# Patient Record
Sex: Female | Born: 1970 | Race: White | Hispanic: No | Marital: Married | Smoking: Former smoker
Health system: Southern US, Community
[De-identification: ages and names within clinical notes are randomized; demographics above are authoritative.]

## PROBLEM LIST (undated history)

## (undated) DIAGNOSIS — E785 Hyperlipidemia, unspecified: Secondary | ICD-10-CM

## (undated) DIAGNOSIS — I10 Essential (primary) hypertension: Secondary | ICD-10-CM

## (undated) DIAGNOSIS — E119 Type 2 diabetes mellitus without complications: Secondary | ICD-10-CM

## (undated) DIAGNOSIS — Z9889 Other specified postprocedural states: Secondary | ICD-10-CM

## (undated) DIAGNOSIS — R112 Nausea with vomiting, unspecified: Secondary | ICD-10-CM

## (undated) DIAGNOSIS — K2270 Barrett's esophagus without dysplasia: Secondary | ICD-10-CM

## (undated) HISTORY — DX: Nausea with vomiting, unspecified: R11.2

## (undated) HISTORY — DX: Essential (primary) hypertension: I10

## (undated) HISTORY — DX: Hyperlipidemia, unspecified: E78.5

## (undated) HISTORY — DX: Other specified postprocedural states: Z98.890

## (undated) HISTORY — PX: UPPER GASTROINTESTINAL ENDOSCOPY: SHX188

---

## 1998-04-04 ENCOUNTER — Inpatient Hospital Stay (HOSPITAL_COMMUNITY): Admission: AD | Admit: 1998-04-04 | Discharge: 1998-04-04 | Payer: Self-pay | Admitting: Obstetrics & Gynecology

## 1998-06-23 ENCOUNTER — Ambulatory Visit (HOSPITAL_COMMUNITY): Admission: RE | Admit: 1998-06-23 | Discharge: 1998-06-23 | Payer: Self-pay | Admitting: *Deleted

## 1998-07-15 ENCOUNTER — Ambulatory Visit (HOSPITAL_COMMUNITY): Admission: RE | Admit: 1998-07-15 | Discharge: 1998-07-15 | Payer: Self-pay | Admitting: *Deleted

## 1998-08-05 ENCOUNTER — Ambulatory Visit (HOSPITAL_COMMUNITY): Admission: RE | Admit: 1998-08-05 | Discharge: 1998-08-05 | Payer: Self-pay | Admitting: Obstetrics

## 1998-09-10 ENCOUNTER — Inpatient Hospital Stay (HOSPITAL_COMMUNITY): Admission: AD | Admit: 1998-09-10 | Discharge: 1998-09-10 | Payer: Self-pay | Admitting: *Deleted

## 1998-09-14 ENCOUNTER — Ambulatory Visit (HOSPITAL_COMMUNITY): Admission: RE | Admit: 1998-09-14 | Discharge: 1998-09-14 | Payer: Self-pay | Admitting: *Deleted

## 1998-11-13 ENCOUNTER — Inpatient Hospital Stay (HOSPITAL_COMMUNITY): Admission: AD | Admit: 1998-11-13 | Discharge: 1998-11-16 | Payer: Self-pay | Admitting: *Deleted

## 1998-11-17 ENCOUNTER — Encounter (HOSPITAL_COMMUNITY): Admission: RE | Admit: 1998-11-17 | Discharge: 1999-02-15 | Payer: Self-pay | Admitting: *Deleted

## 1999-03-20 ENCOUNTER — Encounter (HOSPITAL_COMMUNITY): Admission: RE | Admit: 1999-03-20 | Discharge: 1999-06-18 | Payer: Self-pay | Admitting: *Deleted

## 1999-06-20 ENCOUNTER — Encounter (HOSPITAL_COMMUNITY): Admission: RE | Admit: 1999-06-20 | Discharge: 1999-09-18 | Payer: Self-pay | Admitting: *Deleted

## 1999-07-24 ENCOUNTER — Encounter (INDEPENDENT_AMBULATORY_CARE_PROVIDER_SITE_OTHER): Payer: Self-pay | Admitting: Specialist

## 1999-07-24 ENCOUNTER — Other Ambulatory Visit: Admission: RE | Admit: 1999-07-24 | Discharge: 1999-07-24 | Payer: Self-pay | Admitting: Obstetrics

## 2001-09-04 ENCOUNTER — Other Ambulatory Visit: Admission: RE | Admit: 2001-09-04 | Discharge: 2001-09-04 | Payer: Self-pay | Admitting: Family Medicine

## 2002-12-10 ENCOUNTER — Other Ambulatory Visit: Admission: RE | Admit: 2002-12-10 | Discharge: 2002-12-10 | Payer: Self-pay | Admitting: Family Medicine

## 2006-12-19 ENCOUNTER — Ambulatory Visit (HOSPITAL_COMMUNITY): Admission: RE | Admit: 2006-12-19 | Discharge: 2006-12-19 | Payer: Self-pay | Admitting: Obstetrics and Gynecology

## 2009-01-23 ENCOUNTER — Inpatient Hospital Stay (HOSPITAL_COMMUNITY): Admission: AD | Admit: 2009-01-23 | Discharge: 2009-01-23 | Payer: Self-pay | Admitting: Obstetrics and Gynecology

## 2009-01-31 ENCOUNTER — Inpatient Hospital Stay (HOSPITAL_COMMUNITY): Admission: AD | Admit: 2009-01-31 | Discharge: 2009-01-31 | Payer: Self-pay | Admitting: Obstetrics & Gynecology

## 2009-02-09 ENCOUNTER — Inpatient Hospital Stay (HOSPITAL_COMMUNITY): Admission: AD | Admit: 2009-02-09 | Discharge: 2009-02-10 | Payer: Self-pay | Admitting: Obstetrics & Gynecology

## 2009-03-01 ENCOUNTER — Inpatient Hospital Stay (HOSPITAL_COMMUNITY): Admission: AD | Admit: 2009-03-01 | Discharge: 2009-03-03 | Payer: Self-pay | Admitting: Obstetrics and Gynecology

## 2010-09-22 LAB — CBC
HCT: 37.4 % (ref 36.0–46.0)
Hemoglobin: 10.7 g/dL — ABNORMAL LOW (ref 12.0–15.0)
Hemoglobin: 12.8 g/dL (ref 12.0–15.0)
MCHC: 34.2 g/dL (ref 30.0–36.0)
MCV: 87 fL (ref 78.0–100.0)
Platelets: 285 10*3/uL (ref 150–400)
Platelets: 361 10*3/uL (ref 150–400)
RBC: 4.3 MIL/uL (ref 3.87–5.11)
RDW: 14.9 % (ref 11.5–15.5)
RDW: 15.2 % (ref 11.5–15.5)
WBC: 19.7 10*3/uL — ABNORMAL HIGH (ref 4.0–10.5)

## 2010-09-22 LAB — RPR: RPR Ser Ql: NONREACTIVE

## 2010-09-23 LAB — URINALYSIS, ROUTINE W REFLEX MICROSCOPIC
Glucose, UA: NEGATIVE mg/dL
Nitrite: NEGATIVE
Protein, ur: NEGATIVE mg/dL
Urobilinogen, UA: 0.2 mg/dL (ref 0.0–1.0)

## 2010-09-23 LAB — URINE MICROSCOPIC-ADD ON

## 2010-09-23 LAB — FETAL FIBRONECTIN: Fetal Fibronectin: NEGATIVE

## 2013-11-10 ENCOUNTER — Ambulatory Visit
Admission: RE | Admit: 2013-11-10 | Discharge: 2013-11-10 | Disposition: A | Discharge status: Home / Self Care | Payer: Managed Care, Other (non HMO) | Source: Ambulatory Visit | Attending: Family | Admitting: Family

## 2013-11-10 ENCOUNTER — Other Ambulatory Visit: Payer: Self-pay | Admitting: Family

## 2013-11-10 DIAGNOSIS — M79609 Pain in unspecified limb: Secondary | ICD-10-CM

## 2014-07-06 ENCOUNTER — Ambulatory Visit: Payer: Managed Care, Other (non HMO)

## 2014-07-13 ENCOUNTER — Ambulatory Visit: Payer: Managed Care, Other (non HMO)

## 2014-07-20 ENCOUNTER — Ambulatory Visit: Payer: Managed Care, Other (non HMO)

## 2015-06-08 ENCOUNTER — Inpatient Hospital Stay: Admit: 2015-06-08 | Payer: Managed Care, Other (non HMO) | Admitting: Psychiatry

## 2015-10-03 ENCOUNTER — Other Ambulatory Visit: Payer: Self-pay | Admitting: Obstetrics and Gynecology

## 2015-10-03 DIAGNOSIS — R928 Other abnormal and inconclusive findings on diagnostic imaging of breast: Secondary | ICD-10-CM

## 2015-10-11 ENCOUNTER — Ambulatory Visit
Admission: RE | Admit: 2015-10-11 | Discharge: 2015-10-11 | Disposition: A | Discharge status: Home / Self Care | Payer: Managed Care, Other (non HMO) | Source: Ambulatory Visit | Attending: Obstetrics and Gynecology | Admitting: Obstetrics and Gynecology

## 2015-10-11 DIAGNOSIS — R928 Other abnormal and inconclusive findings on diagnostic imaging of breast: Secondary | ICD-10-CM

## 2016-05-26 ENCOUNTER — Encounter (HOSPITAL_COMMUNITY): Payer: Self-pay | Admitting: *Deleted

## 2016-05-26 ENCOUNTER — Ambulatory Visit (HOSPITAL_COMMUNITY)
Admission: EM | Admit: 2016-05-26 | Discharge: 2016-05-26 | Disposition: A | Discharge status: Nursing Home (Includes ICF) | Payer: Managed Care, Other (non HMO) | Attending: Family Medicine | Admitting: Family Medicine

## 2016-05-26 ENCOUNTER — Encounter (HOSPITAL_COMMUNITY): Payer: Self-pay

## 2016-05-26 ENCOUNTER — Emergency Department (HOSPITAL_COMMUNITY)
Admission: EM | Admit: 2016-05-26 | Discharge: 2016-05-26 | Disposition: A | Discharge status: Home / Self Care | Payer: Managed Care, Other (non HMO) | Attending: Emergency Medicine | Admitting: Emergency Medicine

## 2016-05-26 DIAGNOSIS — E119 Type 2 diabetes mellitus without complications: Secondary | ICD-10-CM | POA: Insufficient documentation

## 2016-05-26 DIAGNOSIS — R1310 Dysphagia, unspecified: Secondary | ICD-10-CM

## 2016-05-26 DIAGNOSIS — Z7984 Long term (current) use of oral hypoglycemic drugs: Secondary | ICD-10-CM | POA: Insufficient documentation

## 2016-05-26 DIAGNOSIS — Z87891 Personal history of nicotine dependence: Secondary | ICD-10-CM | POA: Insufficient documentation

## 2016-05-26 DIAGNOSIS — K228 Other specified diseases of esophagus: Secondary | ICD-10-CM | POA: Diagnosis not present

## 2016-05-26 DIAGNOSIS — K2289 Other specified disease of esophagus: Secondary | ICD-10-CM

## 2016-05-26 HISTORY — DX: Barrett's esophagus without dysplasia: K22.70

## 2016-05-26 HISTORY — DX: Type 2 diabetes mellitus without complications: E11.9

## 2016-05-26 MED ORDER — GI COCKTAIL ~~LOC~~
30.0000 mL | Freq: Once | ORAL | Status: AC
  Administered 2016-05-26: 30 mL via ORAL

## 2016-05-26 MED ORDER — GI COCKTAIL ~~LOC~~
ORAL | Status: AC
  Filled 2016-05-26: qty 30

## 2016-05-26 MED ORDER — SUCRALFATE 1 G PO TABS: 1.0000 g | ORAL_TABLET | Freq: Two times a day (BID) | ORAL | 0 refills | Status: DC

## 2016-05-26 MED ORDER — ESOMEPRAZOLE MAGNESIUM 40 MG PO CPDR: 40.0000 mg | DELAYED_RELEASE_CAPSULE | Freq: Every day | ORAL | 0 refills | Status: DC

## 2016-05-26 MED ORDER — ESOMEPRAZOLE MAGNESIUM 40 MG PO CPDR
40.0000 mg | DELAYED_RELEASE_CAPSULE | Freq: Every day | ORAL | 0 refills | Status: DC
Start: 2016-05-26 — End: 2016-05-26

## 2016-05-26 MED ORDER — SUCRALFATE 1 G PO TABS
1.0000 g | ORAL_TABLET | Freq: Once | ORAL | Status: AC
  Administered 2016-05-26: 1 g via ORAL
  Filled 2016-05-26: qty 1

## 2016-05-26 MED ORDER — SUCRALFATE 1 GM/10ML PO SUSP: 1.0000 g | Freq: Two times a day (BID) | ORAL | 0 refills | Status: DC

## 2016-05-26 NOTE — ED Triage Notes (Signed)
Also  Has  Burping

## 2016-05-26 NOTE — ED Provider Notes (Signed)
CSN: [id number]     Arrival date & time 05/26/16  1719 History   None    Chief Complaint  Patient presents with  . Dysphagia   (Consider location/radiation/quality/duration/timing/severity/associated sxs/prior Treatment) 45 y.o. female presents with esophageal pain that radiates to sternum and increase in belchingX 1 day. Patient states that the pain initially occurred while she was eating kidney beans last night. Patient states that initially she made herself vomit "Thinking I had something stuck there" Patient denies any relief after vomiting. Patient has a history of barett's esophagus with known ulcers. Patient states that she has been taking her pepcid and denies any nausea, vomiting or chest pain. Patient is belching during the assessment      Past Medical History:  Diagnosis Date  . Barrett esophagus   . Diabetes mellitus without complication ([place])    No past surgical history on file. History reviewed. No pertinent family history. Social History  Substance Use Topics  . Smoking status: Former Research scientist (life sciences)  . Smokeless tobacco: Never Used  . Alcohol use No   OB History    No data available     Review of Systems  Constitutional: Negative for chills and fever.  HENT: Negative for ear pain and sore throat.        Pain in "throat" like something is stuck"   Eyes: Negative for pain and visual disturbance.  Respiratory: Negative for cough and shortness of breath.   Cardiovascular: Negative for chest pain and palpitations.  Gastrointestinal: Negative for abdominal pain and vomiting.       Belching  Genitourinary: Negative for dysuria and hematuria.  Musculoskeletal: Negative for arthralgias and back pain.  Skin: Negative for color change and rash.  Neurological: Negative for seizures and syncope.  All other systems reviewed and are negative.   Allergies  Patient has no known allergies.  Home Medications   Prior to Admission medications   Medication Sig Start Date End  Date Taking? Authorizing Provider  Dapagliflozin-Metformin HCl (XIGDUO XR PO) Take by mouth.   Yes Historical Provider, MD  Dulaglutide (TRULICITY [place]) Inject into the skin.   Yes Historical Provider, MD  FLUoxetine HCl (PROZAC PO) Take by mouth.   Yes Historical Provider, MD  GLIPIZIDE PO Take by mouth.   Yes Historical Provider, MD  Omeprazole (PRILOSEC PO) Take by mouth.   Yes Historical Provider, MD   Meds Ordered and Administered this Visit   Medications  gi cocktail (Maalox,Lidocaine,Donnatal) (30 mLs Oral Given 05/26/16 1755)    BP 144/85 (BP Location: Right Arm)   Pulse 83   Temp 98.6 F (37 C) (Oral)   Resp 18   SpO2 99%  No data found.   Physical Exam  Constitutional: She is oriented to person, place, and time. She appears well-developed and well-nourished.  HENT:  Head: Normocephalic and atraumatic.  Eyes: Conjunctivae are normal.  Neck: Normal range of motion.  Cardiovascular: Normal rate and regular rhythm.   Pulmonary/Chest: Effort normal and breath sounds normal.  Abdominal: Soft. Bowel sounds are normal.  Neurological: She is alert and oriented to person, place, and time.  Psychiatric: She has a normal mood and affect.  Nursing note and vitals reviewed.   Urgent Care Course   Clinical Course     Procedures (including critical care time)  Labs Review Labs Reviewed - No data to display  Imaging Review No results found.     MDM   1. Esophageal pain        Gerline Legacy  Jaelynn Pozo, NP 05/26/16 1806

## 2016-05-26 NOTE — ED Notes (Signed)
Pt was able to swallow po medication successfully with mild discomfort

## 2016-05-26 NOTE — ED Triage Notes (Signed)
Pt   Noticed        Feels  Like    Something in throat      Last       She     Reports   Able  To  Eat   And  Able  To    Swallow   But  Feels      Like   A  Lump in throat   History  Of  gerd       And  barrets    Esophagus      She  Is  Sitting  Upright on exam table  In no  Severe  Distress

## 2016-05-26 NOTE — ED Provider Notes (Signed)
[facility] DEPT Provider Note   CSN: [id number] Arrival date & time: 05/26/16  1811     History   Chief Complaint Chief Complaint  Patient presents with  . Sore Throat    HPI Leslie Hanna is a 45 y.o. female.  Leslie Hanna is a 46 y.o. female with h/o barrett esophagus, DM, and HTN presents to ED with complaint of dysphagia. Patient reports after eating chili last night she experienced a burning pain sensation in her lower esophagus. She thought a kidney bean was stuck and preceded to make herself vomit. This did not provide relief. She was subsequently seen at the UC today and encouraged to come to the ED for further evaluation. She reports a burning pain in her lower esophagus that is only present with eating and drinking, otherwise she does not have pain. She reports a sensation that "something is stuck." She is able to still eat and drink, but states it is painful. No difficulty breathing. She denies any other complaints. She is followed for [facility] for her barrett esophagus.       Past Medical History:  Diagnosis Date  . Barrett esophagus   . Diabetes mellitus without complication ([place])     There are no active problems to display for this patient.   History reviewed. No pertinent surgical history.  OB History    No data available       Home Medications    Prior to Admission medications   Medication Sig Start Date End Date Taking? Authorizing Provider  Dapagliflozin-Metformin HCl (XIGDUO XR PO) Take by mouth.    Historical Provider, MD  Dulaglutide (TRULICITY [place]) Inject into the skin.    Historical Provider, MD  esomeprazole (NEXIUM) 40 MG capsule Take 1 capsule (40 mg total) by mouth daily. 05/26/16   Roxanna Mew, PA-C  FLUoxetine HCl (PROZAC PO) Take by mouth.    Historical Provider, MD  GLIPIZIDE PO Take by mouth.    Historical Provider, MD  sucralfate (CARAFATE) 1 g tablet Take 1 tablet (1 g total) by mouth 2 (two) times daily. 05/26/16    Roxanna Mew, PA-C    Family History No family history on file.  Social History Social History  Substance Use Topics  . Smoking status: Former Research scientist (life sciences)  . Smokeless tobacco: Never Used  . Alcohol use No     Allergies   Patient has no known allergies.   Review of Systems Review of Systems  Constitutional: Negative for chills, diaphoresis and fever.  HENT: Positive for trouble swallowing ( pain with swallowing).   Eyes: Negative for visual disturbance.  Respiratory: Negative for shortness of breath.   Cardiovascular: Negative for chest pain.  Gastrointestinal: Negative for abdominal pain, diarrhea, nausea and vomiting.  Genitourinary: Negative for dysuria and hematuria.  Musculoskeletal: Negative for back pain.  Skin: Negative for rash.  Neurological: Negative for syncope.     Physical Exam Updated Vital Signs BP 147/79   Pulse 81   Temp 98.7 F (37.1 C) (Oral)   Resp 21   Ht 5\' 6"  (1.676 m)   Wt 108.5 kg   SpO2 99%   BMI 38.60 kg/m   Physical Exam  Constitutional: She appears well-developed and well-nourished. No distress.  HENT:  Head: Normocephalic and atraumatic.  Mouth/Throat: Oropharynx is clear and moist. No oropharyngeal exudate.  Eyes: Conjunctivae and EOM are normal. Pupils are equal, round, and reactive to light. Right eye exhibits no discharge. Left eye exhibits no discharge. No scleral icterus.  Neck: Normal range of motion and phonation normal. Neck supple. No neck rigidity. Normal range of motion present.  Cardiovascular: Normal rate, regular rhythm, normal heart sounds and intact distal pulses.   No murmur heard. Pulmonary/Chest: Effort normal and breath sounds normal. No stridor. No respiratory distress. She has no wheezes. She has no rales. She exhibits no tenderness.  Abdominal: Soft. Bowel sounds are normal. She exhibits no distension. There is no tenderness. There is no rigidity, no rebound, no guarding and no CVA tenderness.    Musculoskeletal: Normal range of motion.  Lymphadenopathy:    She has no cervical adenopathy.  Neurological: She is alert. She is not disoriented. Coordination and gait normal. GCS eye subscore is 4. GCS verbal subscore is 5. GCS motor subscore is 6.  Skin: Skin is warm and dry. She is not diaphoretic.  Psychiatric: She has a normal mood and affect. Her behavior is normal.     ED Treatments / Results  Labs (all labs ordered are listed, but only abnormal results are displayed) Labs Reviewed - No data to display  EKG  EKG Interpretation None       Radiology No results found.  Procedures Procedures (including critical care time)  Medications Ordered in ED Medications  sucralfate (CARAFATE) tablet 1 g (1 g Oral Given 05/26/16 1949)     Initial Impression / Assessment and Plan / ED Course  I have reviewed the triage vital signs and the nursing notes.  Pertinent labs & imaging results that were available during my care of the patient were reviewed by me and considered in my medical decision making (see chart for details).  Clinical Course     Patient presents to ED with complaint of dysphagia onset last night after eating chili. Patient is able to eat and drink. Denies SOB. Patient is afebrile and non-toxic appearing in NAD. VSS.  Patient is reclining comfortably in the exam bed. Respirations are unlabored. No stridor. No cough. Patient tolerated PO medication in ED. Discussed with patient sxs may be secondary to barrett esophagus. Will change prilosec to nexium for improved acid blockage. Rx carafate. Encouraged follow up with GI next week for re-evaluation if symptoms persist. Return precautions given. Pt voiced understanding and is agreeable.   Upon d/c patient requested to speak with MD. Dr. Jeneen Rinks graciously spoke with patient. Agrees with plan.    Final Clinical Impressions(s) / ED Diagnoses   Final diagnoses:  Dysphagia, unspecified type    New Prescriptions New  Prescriptions   ESOMEPRAZOLE (NEXIUM) 40 MG CAPSULE    Take 1 capsule (40 mg total) by mouth daily.   SUCRALFATE (CARAFATE) 1 G TABLET    Take 1 tablet (1 g total) by mouth 2 (two) times daily.     Roxanna Mew, [place] 05/26/16 2013    Tanna Furry, MD 06/02/16 [phone]

## 2016-05-26 NOTE — ED Triage Notes (Signed)
Pt. Was eating beans last night and felt like something got stuck.  Made her self throw up and since then when you swallow you have chest burning.  Skin is warm and dry and pink. No sob noted. Pt. Denies any pain at present time.  She went to our urgent care and was sent down for further eval.  No distress  Noted.

## 2016-05-26 NOTE — Discharge Instructions (Signed)
Read the information below.  Your PPI is being changed from prilosec to nexium. You are also being prescribed Carafate. Please take as directed.  If symptoms persist, please follow up with your GI (stomach) doctor, I have provided the contact information for [facility] above, you may need an endoscope.  Use the prescribed medication as directed.  Please discuss all new medications with your pharmacist.   You may return to the Emergency Department at any time for worsening condition or any new symptoms that concern you. Return to ED if you develop difficulty swallowing, shortness of breath, coughing/choking, or any other new/concerning symptoms.

## 2016-07-19 ENCOUNTER — Ambulatory Visit (INDEPENDENT_AMBULATORY_CARE_PROVIDER_SITE_OTHER): Payer: Managed Care, Other (non HMO) | Admitting: Podiatry

## 2016-07-19 ENCOUNTER — Encounter: Payer: Self-pay | Admitting: Podiatry

## 2016-07-19 VITALS — BP 148/87 | HR 98 | Resp 16 | Ht 66.0 in | Wt 235.0 lb

## 2016-07-19 DIAGNOSIS — L600 Ingrowing nail: Secondary | ICD-10-CM

## 2016-07-19 NOTE — Patient Instructions (Signed)

## 2016-07-19 NOTE — Progress Notes (Signed)
   Subjective:    Patient ID: Leslie Hanna, female    DOB: [birth year], 46 y.o.   MRN: [id number]  HPI Chief Complaint  Patient presents with  . Nail Problem    Right foot; great toe-lateral side; pt stated, "Went to urgent care in January 2018 and got toe drained; was told had abscess of the nail bed"; x3 weeks      Review of Systems  All other systems reviewed and are negative.      Objective:   Physical Exam        Assessment & Plan:

## 2016-07-19 NOTE — Progress Notes (Signed)
Subjective:     Patient ID: Leslie Hanna, female   DOB: 05-16-1971, 46 y.o.   MRN: [id number]  HPI patient states that she's had chronic ingrown toenail the right big toe lateral border and it's been making it hard for her to wear shoe gear comfortably she's tried to trim it out herself without relief. Patient does have diabetes that she keeps under reasonably good control with her average level around 150   Review of Systems  All other systems reviewed and are negative.      Objective:   Physical Exam  Constitutional: She is oriented to person, place, and time.  Cardiovascular: Intact distal pulses.   Musculoskeletal: Normal range of motion.  Neurological: She is oriented to person, place, and time.  Skin: Skin is warm.  Nursing note and vitals reviewed.  neurovascular status intact muscle strength adequate range of motion within normal limits with patient found to have incurvated right hallux lateral border with pain when pressed and slight distal redness with no active drainage     Assessment:     Ingrown toenail deformity right hallux lateral border with pain    Plan:     H&P condition reviewed and recommended removal of the nail border. Patient wants procedure and today I explained the risk of surgery and I infiltrated the right hallux 60 Milligan times like Marcaine mixture remove the corner exposed matrix and applied phenol 3 applications 30 seconds followed by alcohol lavage and sterile dressing. Given instructions on soaks and reappoint

## 2016-08-09 ENCOUNTER — Ambulatory Visit (INDEPENDENT_AMBULATORY_CARE_PROVIDER_SITE_OTHER): Payer: Managed Care, Other (non HMO) | Admitting: Podiatry

## 2016-08-09 ENCOUNTER — Encounter: Payer: Self-pay | Admitting: Podiatry

## 2016-08-09 VITALS — BP 130/77 | HR 99 | Resp 16

## 2016-08-09 DIAGNOSIS — L03031 Cellulitis of right toe: Secondary | ICD-10-CM | POA: Diagnosis not present

## 2016-08-09 NOTE — Patient Instructions (Signed)

## 2016-08-10 NOTE — Progress Notes (Signed)
Subjective:     Patient ID: Leslie Hanna, female   DOB: Jun 16, 1971, 46 y.o.   MRN: [id number]  HPI patient presents stating she was concerned about the position of her hallux nail and discoloration with no active drainage noted   Review of Systems     Objective:   Physical Exam Neurovascular status intact with slight irritation of the hallux nail where the nail border was taken out but it's localized in nature with no proximal edema erythema drainage noted and no odor emitting from the area    Assessment:     Mild localized paronychia infection with no indications of proximal spread    Plan:     Reviewed condition and recommended continued soaks bandage usage during the day and letting it air dry at night. If any further redness should occur or any increased drainage odor or swelling all immediately we'll start an antibiotic but I would like to do not have to uses if we don't need to

## 2017-11-04 ENCOUNTER — Encounter: Payer: Self-pay | Admitting: Podiatry

## 2017-11-04 ENCOUNTER — Ambulatory Visit (INDEPENDENT_AMBULATORY_CARE_PROVIDER_SITE_OTHER): Payer: Managed Care, Other (non HMO)

## 2017-11-04 ENCOUNTER — Other Ambulatory Visit: Payer: Self-pay | Admitting: Podiatry

## 2017-11-04 ENCOUNTER — Ambulatory Visit: Payer: Managed Care, Other (non HMO) | Admitting: Podiatry

## 2017-11-04 VITALS — BP 143/84 | HR 91

## 2017-11-04 DIAGNOSIS — D492 Neoplasm of unspecified behavior of bone, soft tissue, and skin: Secondary | ICD-10-CM | POA: Diagnosis not present

## 2017-11-04 DIAGNOSIS — M79672 Pain in left foot: Secondary | ICD-10-CM

## 2017-11-06 NOTE — Progress Notes (Signed)
Subjective:   Patient ID: Leslie Hanna, female   DOB: 47 y.o.   MRN: [id number]   HPI Patient presents stating I have a small knot on the outside of my left foot but can become sore and I do not remember specific injury.  Patient presents going a little bit but it is remain relatively stable   ROS      Objective:  Physical Exam  Neurovascular status intact with outside of left foot showing small approximate 7 mm x 7 mm nodule that appears to be within subcutaneous tissue and is mildly tender     Assessment:  Probability for ganglionic cyst or inflammatory area with possible hemangioma or hematoma     Plan:  H&P x-ray reviewed and today I did careful injection to reduce the inflammation around this and try to shrink it.  I advised that it may require excision if symptoms were to persist or it were to get bigger or become more painful  X-rays indicate no indications of arthritis or calcification

## 2018-12-22 ENCOUNTER — Telehealth: Payer: Self-pay | Admitting: Hematology

## 2018-12-22 NOTE — Telephone Encounter (Signed)
Called and spoke with patient regarding NP Referral appointment from Dr Laverta Baltimore date/time/location/NO visitor policy/advised her to bring picture ID,Insuance Card & list of nay medications she was currently taking.

## 2018-12-31 ENCOUNTER — Inpatient Hospital Stay: Payer: Managed Care, Other (non HMO) | Admitting: Nurse Practitioner

## 2018-12-31 ENCOUNTER — Encounter: Payer: Self-pay | Admitting: Nurse Practitioner

## 2018-12-31 ENCOUNTER — Other Ambulatory Visit: Payer: Self-pay

## 2018-12-31 ENCOUNTER — Inpatient Hospital Stay: Payer: Managed Care, Other (non HMO) | Attending: Nurse Practitioner

## 2018-12-31 VITALS — BP 107/70 | HR 97 | Temp 98.9°F | Resp 19 | Ht 66.0 in | Wt 222.4 lb

## 2018-12-31 DIAGNOSIS — D72829 Elevated white blood cell count, unspecified: Secondary | ICD-10-CM | POA: Diagnosis present

## 2018-12-31 DIAGNOSIS — Z79899 Other long term (current) drug therapy: Secondary | ICD-10-CM | POA: Insufficient documentation

## 2018-12-31 LAB — CBC WITH DIFFERENTIAL (CANCER CENTER ONLY)
Abs Immature Granulocytes: 0.07 10*3/uL (ref 0.00–0.07)
Basophils Absolute: 0.1 10*3/uL (ref 0.0–0.1)
Basophils Relative: 0 %
Eosinophils Absolute: 0.3 10*3/uL (ref 0.0–0.5)
Eosinophils Relative: 2 %
HCT: 44.5 % (ref 36.0–46.0)
Hemoglobin: 14.5 g/dL (ref 12.0–15.0)
Immature Granulocytes: 0 %
Lymphocytes Relative: 20 %
Lymphs Abs: 3.2 10*3/uL (ref 0.7–4.0)
MCH: 27.5 pg (ref 26.0–34.0)
MCHC: 32.6 g/dL (ref 30.0–36.0)
MCV: 84.4 fL (ref 80.0–100.0)
Monocytes Absolute: 1 10*3/uL (ref 0.1–1.0)
Monocytes Relative: 6 %
Neutro Abs: 11.2 10*3/uL — ABNORMAL HIGH (ref 1.7–7.7)
Neutrophils Relative %: 72 %
Platelet Count: 363 10*3/uL (ref 150–400)
RBC: 5.27 MIL/uL — ABNORMAL HIGH (ref 3.87–5.11)
RDW: 14.3 % (ref 11.5–15.5)
WBC Count: 15.7 10*3/uL — ABNORMAL HIGH (ref 4.0–10.5)
nRBC: 0 % (ref 0.0–0.2)

## 2018-12-31 LAB — SAVE SMEAR(SSMR), FOR PROVIDER SLIDE REVIEW

## 2018-12-31 LAB — TECHNOLOGIST SMEAR REVIEW: Tech Review: NORMAL

## 2018-12-31 NOTE — Progress Notes (Addendum)
[facility]  [phone] [phone]  Clinic New Consult Note   Patient Care Team: Shanon Rosser, PA-C as PCP - General (Physician Assistant) 12/31/2018  CHIEF COMPLAINTS/PURPOSE OF CONSULTATION:  Abnormal WBC, "knot in neck", referred by Shanon Rosser, PA  HISTORY OF PRESENTING ILLNESS:  Leslie Hanna 48 y.o. female is here because of elevated white blood cell count and a "knot in her neck."  She was found to have abnormal CBC in 03/02/2009 with WBC 23.2 and mild anemia Hgb 10.7 at the time her son was born. She had a daughter born in 2000 with special needs who passed away at age 59.  Most recent CBC on 12/10/18 with WBC 14.9 and predominant neutrophils 11.29. She developed a left neck mass 3 weeks ago. Nodule has been stable in size.  Denies pain. She has seasonal allergies but denies symptoms of congestion, sore throat, cough.  She denies history of chronic inflammation or autoimmune disease.  She denies steroid use.  Denies abdominal surgery or splenectomy.  PMH is significant for DM, osteoarthritis, and history of GI ulcers.  She quit smoking cigarettes in 2015.  She works at [employer].  She denies personal or family history of blood disorder or leukemia/lymphoma.  Maternal grandfather had lung cancer, 3 paternal aunts had breast cancer in their 81s-50s.  Today she presents by herself.  She has had a frequent headache over the last few weeks, denies vision change.  Denies fever, night sweats, weight loss.  Appetite is normal.  Fatigue increases with blood sugar elevation.  Denies change in bowel habits, denies bleeding.  Has occasional lower abdominal pain.  Denies cough, chest pain, dyspnea.  Denies rash.  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  Denies pain in general.  Denies recent injury or infection.  Denies dental issue.  Has not been on recent antibiotics.  MEDICAL HISTORY:  Past Medical History:  Diagnosis Date  . Barrett esophagus   . Diabetes  mellitus without complication ([place])     SURGICAL HISTORY: No past surgical history on file.  SOCIAL HISTORY: Social History   Socioeconomic History  . Marital status: Married    Spouse name: Not on file  . Number of children: Not on file  . Years of education: Not on file  . Highest education level: Not on file  Occupational History  . Not on file  Social Needs  . Financial resource strain: Not on file  . Food insecurity    Worry: Not on file    Inability: Not on file  . Transportation needs    Medical: Not on file    Non-medical: Not on file  Tobacco Use  . Smoking status: Former Research scientist (life sciences)  . Smokeless tobacco: Never Used  Substance and Sexual Activity  . Alcohol use: No  . Drug use: No  . Sexual activity: Not on file  Lifestyle  . Physical activity    Days per week: Not on file    Minutes per session: Not on file  . Stress: Not on file  Relationships  . Social Herbalist on phone: Not on file    Gets together: Not on file    Attends religious service: Not on file    Active member of club or organization: Not on file    Attends meetings of clubs or organizations: Not on file    Relationship status: Not on file  . Intimate partner violence    Fear of current or ex partner:  Not on file    Emotionally abused: Not on file    Physically abused: Not on file    Forced sexual activity: Not on file  Other Topics Concern  . Not on file  Social History Narrative  . Not on file    FAMILY HISTORY: Family History  Problem Relation Age of Onset  . Cancer Paternal Aunt        breast  . Cancer Maternal Grandfather        lung  . Cancer Paternal Aunt        breast  . Cancer Paternal Aunt        breast     ALLERGIES:  has No Known Allergies.  MEDICATIONS:  Current Outpatient Medications  Medication Sig Dispense Refill  . dapagliflozin propanediol (FARXIGA) 10 MG TABS tablet Farxiga 10 mg tablet    . DULoxetine (CYMBALTA) 20 MG capsule Take 20 mg by  mouth daily.    Marland Kitchen lisinopril (ZESTRIL) 10 MG tablet Take 10 mg by mouth daily.    Marland Kitchen NOVOFINE 32G X 6 MM MISC     . omeprazole (PRILOSEC) 20 MG capsule Take 20 mg by mouth daily.    . rosuvastatin (CRESTOR) 10 MG tablet Take 10 mg by mouth daily.    . sitaGLIPtin (JANUVIA) 100 MG tablet Take 100 mg by mouth daily.    Tyler Aas FLEXTOUCH 200 UNIT/ML SOPN      No current facility-administered medications for this visit.     PHYSICAL EXAMINATION: ECOG PERFORMANCE STATUS: 0 - Asymptomatic  Vitals:   12/31/18 1332  BP: 107/70  Pulse: 97  Resp: 19  Temp: 98.9 F (37.2 C)  SpO2: 98%   Filed Weights   12/31/18 1332  Weight: 222 lb 6.4 oz (100.9 kg)    GENERAL:alert, no distress and comfortable SKIN: no rashes or significant lesions EYES:  sclera clear OROPHARYNX: no exudate or erythema LYMPH: approx 1 cm left posterior cervical adenopathy; no other palpable supra/infraclavicular, axillary, or inguinal lymphadenopathy  LUNGS: Respirations even and unlabored HEART:  no lower extremity edema ABDOMEN:abdomen soft, round. Musculoskeletal: no cyanosis of digits  PSYCH: alert & oriented x 3 with fluent speech NEURO: Normal gait  LABORATORY DATA:  I have reviewed the data as listed CBC Latest Ref Rng & Units 12/31/2018 03/02/2009 03/01/2009  WBC 4.0 - 10.5 K/uL 15.7(H) 23.2(H) 19.7(H)  Hemoglobin 12.0 - 15.0 g/dL 14.5 10.7(L) 12.8  Hematocrit 36.0 - 46.0 % 44.5 31.1(L) 37.4  Platelets 150 - 400 K/uL 363 285 361    RADIOGRAPHIC STUDIES: I have personally reviewed the radiological images as listed and agreed with the findings in the report. No results found.  ASSESSMENT & PLAN: 48 year old female with past medical history of DM, OA, and seasonal allergies  1.  Leukocytosis with predominant neutrophilia -We reviewed her medical record in detail which shows leukocytosis with predominant neutrophillia dating back to 2010, I do not have access to serial labs.  Repeat CBC today shows WBC  15.7, ANC 11.2.  She has a palpable left posterior cervical node, she is otherwise asymptomatic.  -Dr. Burr Medico discussed common causes of elevated WBC and neutrophils including recent infection, inflammation, splenomegaly, medication, or MPN/bone marrow disorder.  -Given her mild leukocytosis over many years ago, her leukocytosis is likely reactive -Dr. Burr Medico will review smear and obtain BCR/ABL and JAK2 to r/o MPN. Will obtain abd [place] to r/o splenomegaly.  -if the above work up is negative, will likely repeat labs in 6 months and  f/u in 1 year. If CBC remains stable, she can f/u with PCP and with [place] only as needed. She will monitor the palpable LN.  -Patient agrees with the plan  PLAN: -Lab today -abd [place] in 1-2 weeks -Will call patient after work up is complete to review results  -Lab in 6 and 12 months -F/u in 1 year  Orders Placed This Encounter  Procedures  . US Abdomen Complete    Standing Status:   Future    Standing Expiration Date:   12/31/2019    Order Specific Question:   Reason for Exam (SYMPTOM  OR DIAGNOSIS REQUIRED)    Answer:   elevated WBC, evaluate spleen    Order Specific Question:   Preferred imaging location?    Answer:   [facility]  . CBC with Differential ([place] Only)    Standing Status:   Standing    Number of Occurrences:   20    Standing Expiration Date:   12/31/2019  . Save Smear (SSMR)    Standing Status:   Future    Number of Occurrences:   1    Standing Expiration Date:   12/31/2019  . Technologist smear review    Standing Status:   Future    Number of Occurrences:   1    Standing Expiration Date:   12/31/2019  . JAK2 (INCLUDING V617F AND EXON 12), MPL,& CALR W/RFL MPN PANEL (NGS)    Standing Status:   Future    Number of Occurrences:   1    Standing Expiration Date:   12/31/2019  . BCR ABL1 FISH ([facility])    Standing Status:   Future    Number of Occurrences:   1    Standing Expiration Date:   12/31/2019    All questions were answered.  The patient knows to call the clinic with any problems, questions or concerns.     Alla Feeling, NP 12/31/2018 4:43 PM  Addendum  I have seen the patient, examined her. I agree with the assessment and and plan and have edited the notes.   Ms Aguado presented with leukocytosis, with predominant neutrophils. She had documented elevated WBC 10 years ago, and per pt she has chronic leukocytosis since 20's. This is likely reactive, possible related to allergy, although she does not have daily symptoms. Will repeat lab and review her smear, and rule out CML and MPN by BCR/ABL and JAK2 mutation tests, and will get abdominal US to rule out splemagely. If work up negative, will f/u once in 6 months with CBC and diff, and probably as needed in future.  Truitt Merle  12/31/2018

## 2019-01-01 ENCOUNTER — Telehealth: Payer: Self-pay | Admitting: Hematology

## 2019-01-01 NOTE — Telephone Encounter (Signed)
Scheduled appt per 7/15 los. ° °Printed and mailed appt calendar. °

## 2019-01-06 ENCOUNTER — Telehealth: Payer: Self-pay

## 2019-01-06 ENCOUNTER — Encounter: Payer: Self-pay | Admitting: Nurse Practitioner

## 2019-01-06 NOTE — Telephone Encounter (Signed)
Per Leslie Hanna scheduled patient for ultrasound of liver and spleen Friday 01/09/19 @9 :30am here at [facility]. Patient is to arrive by 9:15am and to be NPO after midnight. Patient is aware of appointment date, time, and instructions.

## 2019-01-09 ENCOUNTER — Ambulatory Visit (HOSPITAL_COMMUNITY)
Admission: RE | Admit: 2019-01-09 | Discharge: 2019-01-09 | Disposition: A | Discharge status: Home / Self Care | Payer: Managed Care, Other (non HMO) | Source: Ambulatory Visit | Attending: Nurse Practitioner | Admitting: Nurse Practitioner

## 2019-01-09 ENCOUNTER — Other Ambulatory Visit: Payer: Self-pay

## 2019-01-09 DIAGNOSIS — D72829 Elevated white blood cell count, unspecified: Secondary | ICD-10-CM | POA: Insufficient documentation

## 2019-01-15 ENCOUNTER — Encounter: Payer: Self-pay | Admitting: Nurse Practitioner

## 2019-01-26 ENCOUNTER — Telehealth: Payer: Self-pay | Admitting: Nurse Practitioner

## 2019-01-26 NOTE — Telephone Encounter (Signed)
Called patient to review work up including BCR/ABL, JAK2 and [place] are negative. No evidence of enlarged spleen or bone marrow disease. Leukocytosis and enlarged LN is likely reactive. F/u as scheduled. She is aware to call sooner with any new or worsening concerns. She appreciates the call.  Cira Rue, NP  01/26/2019

## 2019-01-28 LAB — JAK2 (INCLUDING V617F AND EXON 12), MPL,& CALR W/RFL MPN PANEL (NGS)

## 2019-01-28 LAB — BCR ABL1 FISH (GENPATH)

## 2019-02-12 ENCOUNTER — Encounter: Payer: Self-pay | Admitting: Nurse Practitioner

## 2019-02-16 ENCOUNTER — Encounter: Payer: Self-pay | Admitting: Nurse Practitioner

## 2019-07-03 ENCOUNTER — Inpatient Hospital Stay: Payer: Managed Care, Other (non HMO) | Attending: Hematology

## 2019-07-03 ENCOUNTER — Other Ambulatory Visit: Payer: Self-pay

## 2019-07-03 DIAGNOSIS — D72829 Elevated white blood cell count, unspecified: Secondary | ICD-10-CM | POA: Insufficient documentation

## 2019-07-03 LAB — CBC WITH DIFFERENTIAL (CANCER CENTER ONLY)
Abs Immature Granulocytes: 0.05 10*3/uL (ref 0.00–0.07)
Basophils Absolute: 0.1 10*3/uL (ref 0.0–0.1)
Basophils Relative: 0 %
Eosinophils Absolute: 0.2 10*3/uL (ref 0.0–0.5)
Eosinophils Relative: 2 %
HCT: 43.7 % (ref 36.0–46.0)
Hemoglobin: 14.6 g/dL (ref 12.0–15.0)
Immature Granulocytes: 0 %
Lymphocytes Relative: 22 %
Lymphs Abs: 2.8 10*3/uL (ref 0.7–4.0)
MCH: 28.2 pg (ref 26.0–34.0)
MCHC: 33.4 g/dL (ref 30.0–36.0)
MCV: 84.5 fL (ref 80.0–100.0)
Monocytes Absolute: 0.6 10*3/uL (ref 0.1–1.0)
Monocytes Relative: 5 %
Neutro Abs: 9.1 10*3/uL — ABNORMAL HIGH (ref 1.7–7.7)
Neutrophils Relative %: 71 %
Platelet Count: 338 10*3/uL (ref 150–400)
RBC: 5.17 MIL/uL — ABNORMAL HIGH (ref 3.87–5.11)
RDW: 13.8 % (ref 11.5–15.5)
WBC Count: 12.8 10*3/uL — ABNORMAL HIGH (ref 4.0–10.5)
nRBC: 0 % (ref 0.0–0.2)

## 2019-07-06 ENCOUNTER — Telehealth: Payer: Self-pay

## 2019-07-06 NOTE — Telephone Encounter (Signed)
-----   Message from Alla Feeling, NP sent at 07/06/2019  8:18 AM EST ----- Please let her know [facility] are better compared to 6 months ago. No other abnormalities on CBC, I have no concerns. Please have her call us if she has new issues. Otherwise will keep lab and f/u in 12/2019. Thanks [employer]

## 2019-07-06 NOTE — Telephone Encounter (Signed)
TC to Pt to inform her Per Lacie that [facility] was better compared to 6 months ago. Informed Pt. That there were no other abnormalities on the CBC and she has no concerns. Pt informed to call if any problems or concerns and Lab and F/U visit will be 12/2019. Pt verbalized understanding no further problems or concerns noted.

## 2019-12-30 NOTE — Progress Notes (Signed)
[place]   [phone] Fax:([phone]) [phone]   Clinic Follow up Note   Patient Care Team: Shanon Rosser, PA-C as PCP - General (Physician Assistant)  Date of Service:  12/31/2019  CHIEF COMPLAINT: F/u of Leukocytosis   CURRENT THERAPY:  Observation    INTERVAL HISTORY:  Leslie Hanna is here for a follow up of Leukocytosis. She was last seen by NP Lacie 1 year ago. She presents to the clinic alone. She denies any new changes or allergy symptoms. She denies cough. She notes she is doing well. She notes she use to smoke for 22.5 years and quit 6 years ago. She notes her 62 year old sister had the top of her colon polyp was cancerous.     REVIEW OF SYSTEMS:   Constitutional: Denies fevers, chills or abnormal weight loss Eyes: Denies blurriness of vision Ears, nose, mouth, throat, and face: Denies mucositis or sore throat Respiratory: Denies cough, dyspnea or wheezes Cardiovascular: Denies palpitation, chest discomfort or lower extremity swelling Gastrointestinal:  Denies nausea, heartburn or change in bowel habits Skin: Denies abnormal skin rashes Lymphatics: Denies new lymphadenopathy or easy bruising Neurological:Denies numbness, tingling or new weaknesses Behavioral/Psych: Mood is stable, no new changes  All other systems were reviewed with the patient and are negative.  MEDICAL HISTORY:  Past Medical History:  Diagnosis Date  . Barrett esophagus   . Diabetes mellitus without complication ([place])     SURGICAL HISTORY: History reviewed. No pertinent surgical history.  I have reviewed the social history and family history with the patient and they are unchanged from previous note.  ALLERGIES:  has No Known Allergies.  MEDICATIONS:  Current Outpatient Medications  Medication Sig Dispense Refill  . dapagliflozin propanediol (FARXIGA) 10 MG TABS tablet Farxiga 10 mg tablet    . DULoxetine (CYMBALTA) 20 MG capsule Take 20 mg by mouth daily.      . metFORMIN (GLUCOPHAGE) 1000 MG tablet Take 1,000 mg by mouth 2 (two) times daily.    Marland Kitchen omeprazole (PRILOSEC) 20 MG capsule Take 20 mg by mouth daily.    . rosuvastatin (CRESTOR) 10 MG tablet Take 10 mg by mouth daily.    Marland Kitchen lisinopril (ZESTRIL) 10 MG tablet Take 10 mg by mouth daily. (Patient not taking: Reported on 12/31/2019)    . NOVOFINE 32G X 6 MM MISC  (Patient not taking: Reported on 12/31/2019)    . sitaGLIPtin (JANUVIA) 100 MG tablet Take 100 mg by mouth daily. (Patient not taking: Reported on 12/31/2019)    . TRESIBA FLEXTOUCH 200 UNIT/ML SOPN  (Patient not taking: Reported on 12/31/2019)     No current facility-administered medications for this visit.    PHYSICAL EXAMINATION: ECOG PERFORMANCE STATUS: 0 - Asymptomatic  Vitals:   12/31/19 1248  BP: 131/80  Pulse: 92  Resp: 18  Temp: (!) 97.3 F (36.3 C)  SpO2: 99%   Filed Weights   12/31/19 1248  Weight: 225 lb 6.4 oz (102.2 kg)    GENERAL:alert, no distress and comfortable SKIN: skin color, texture, turgor are normal, no rashes or significant lesions EYES: normal, Conjunctiva are pink and non-injected, sclera clear  NECK: supple, thyroid normal size, non-tender, without nodularity LYMPH:  no palpable lymphadenopathy in the cervical, axillary  LUNGS: clear to auscultation and percussion with normal breathing effort HEART: regular rate & rhythm and no murmurs and no lower extremity edema ABDOMEN:abdomen soft, non-tender and normal bowel sounds Musculoskeletal:no cyanosis of digits and no clubbing  NEURO: alert & oriented  x 3 with fluent speech, no focal motor/sensory deficits  LABORATORY DATA:  I have reviewed the data as listed CBC Latest Ref Rng & Units 12/31/2019 07/03/2019 12/31/2018  WBC 4.0 - 10.5 K/uL 13.5(H) 12.8(H) 15.7(H)  Hemoglobin 12.0 - 15.0 g/dL 14.4 14.6 14.5  Hematocrit 36 - 46 % 43.3 43.7 44.5  Platelets 150 - 400 K/uL 296 338 363     No flowsheet data found.    RADIOGRAPHIC STUDIES: I have  personally reviewed the radiological images as listed and agreed with the findings in the report. No results found.   ASSESSMENT & PLAN:  Leslie Hanna is a 49 y.o. female with    1. Leukocytosis with predominant neutrophilia -Her lab history has showed leukocytosis with predominant neutrophillia dating back to 2010, I do not have access to serial labs. Repeat CBC from 12/31/18 shows WBC 15.7, ANC 11.2.  She has a palpable left posterior cervical node on exam that day, she is otherwise asymptomatic.  -I discussed common causes of elevated WBC and neutrophils including recent infection, inflammation, splenomegaly, medication, or MPN/bone marrow disorder.  -Her work up including BCR/ABL, JAK2 and 01/09/19 [place] were all negative.  -She did smoke for 22.5 years and quit 6 years ago. This can contribute to reactive leukocytosis  -Given her mild leukocytosis over many years and it has been stable, her leukocytosis is likely reactive -She is clinically doing well and stable. She denies recent infections or obvious allergy flares. Physical exam today unremarkable. Today's labs were reviewed with her. Her WBC initially 15.7 last year, improved to 12.8 in 06/2019 and 13.5 today. Her ANC also improved to 9.8 today.  -This remains reactive. I discussed likely from allergy or mild inflammation in body.  -She can continue observation with her PCP. She can f/u with me as needed in the future.    2. Cancer Screening  -She will proceed with colonoscopy in the next 1-2 given her family history of early colon cancer.  -I recommend she continue yearly mammograms and regular Pap smears -She will continue to f/u with her PCP for health management.    PLAN: -Copy note and labs to her PCP  -F/u as needed in the future.     No problem-specific Assessment & Plan notes found for this encounter.   No orders of the defined types were placed in this encounter.  All questions were answered. The patient knows to  call the clinic with any problems, questions or concerns. No barriers to learning was detected. The total time spent in the appointment was 20 minutes.     Truitt Merle, MD 12/31/2019   I, Joslyn Devon, am acting as scribe for Truitt Merle, MD.   I have reviewed the above documentation for accuracy and completeness, and I agree with the above.

## 2019-12-31 ENCOUNTER — Inpatient Hospital Stay: Payer: Managed Care, Other (non HMO) | Admitting: Hematology

## 2019-12-31 ENCOUNTER — Encounter: Payer: Self-pay | Admitting: Hematology

## 2019-12-31 ENCOUNTER — Inpatient Hospital Stay: Payer: Managed Care, Other (non HMO) | Attending: Hematology

## 2019-12-31 ENCOUNTER — Other Ambulatory Visit: Payer: Self-pay

## 2019-12-31 DIAGNOSIS — D72828 Other elevated white blood cell count: Secondary | ICD-10-CM

## 2019-12-31 DIAGNOSIS — Z79899 Other long term (current) drug therapy: Secondary | ICD-10-CM | POA: Diagnosis not present

## 2019-12-31 DIAGNOSIS — D72829 Elevated white blood cell count, unspecified: Secondary | ICD-10-CM | POA: Insufficient documentation

## 2019-12-31 DIAGNOSIS — E119 Type 2 diabetes mellitus without complications: Secondary | ICD-10-CM | POA: Insufficient documentation

## 2019-12-31 LAB — CBC WITH DIFFERENTIAL (CANCER CENTER ONLY)
Abs Immature Granulocytes: 0.04 10*3/uL (ref 0.00–0.07)
Basophils Absolute: 0.1 10*3/uL (ref 0.0–0.1)
Basophils Relative: 1 %
Eosinophils Absolute: 0.3 10*3/uL (ref 0.0–0.5)
Eosinophils Relative: 2 %
HCT: 43.3 % (ref 36.0–46.0)
Hemoglobin: 14.4 g/dL (ref 12.0–15.0)
Immature Granulocytes: 0 %
Lymphocytes Relative: 20 %
Lymphs Abs: 2.7 10*3/uL (ref 0.7–4.0)
MCH: 28.9 pg (ref 26.0–34.0)
MCHC: 33.3 g/dL (ref 30.0–36.0)
MCV: 86.8 fL (ref 80.0–100.0)
Monocytes Absolute: 0.6 10*3/uL (ref 0.1–1.0)
Monocytes Relative: 5 %
Neutro Abs: 9.8 10*3/uL — ABNORMAL HIGH (ref 1.7–7.7)
Neutrophils Relative %: 72 %
Platelet Count: 296 10*3/uL (ref 150–400)
RBC: 4.99 MIL/uL (ref 3.87–5.11)
RDW: 14.3 % (ref 11.5–15.5)
WBC Count: 13.5 10*3/uL — ABNORMAL HIGH (ref 4.0–10.5)
nRBC: 0 % (ref 0.0–0.2)

## 2020-01-01 ENCOUNTER — Telehealth: Payer: Self-pay | Admitting: Hematology

## 2020-01-01 NOTE — Telephone Encounter (Signed)
F/u as needed per 7/15 los.

## 2020-09-14 ENCOUNTER — Encounter: Payer: Self-pay | Admitting: Podiatry

## 2020-09-14 ENCOUNTER — Ambulatory Visit (INDEPENDENT_AMBULATORY_CARE_PROVIDER_SITE_OTHER): Payer: Managed Care, Other (non HMO)

## 2020-09-14 ENCOUNTER — Other Ambulatory Visit: Payer: Self-pay

## 2020-09-14 ENCOUNTER — Ambulatory Visit: Payer: Managed Care, Other (non HMO) | Admitting: Podiatry

## 2020-09-14 DIAGNOSIS — M79672 Pain in left foot: Secondary | ICD-10-CM

## 2020-09-14 DIAGNOSIS — M7672 Peroneal tendinitis, left leg: Secondary | ICD-10-CM

## 2020-09-14 DIAGNOSIS — M6740 Ganglion, unspecified site: Secondary | ICD-10-CM

## 2020-09-14 MED ORDER — TRIAMCINOLONE ACETONIDE 10 MG/ML IJ SUSP: 10.0000 mg | Freq: Once | INTRAMUSCULAR | Status: DC

## 2020-09-14 NOTE — Progress Notes (Signed)
Subjective:   Patient ID: Leslie Hanna, female   DOB: 50 y.o.   MRN: [id number]   HPI Patient states she has a lot of inflammation in the outside of the left foot and feels like it is a cyst and its been enlarged and painful.  States it is worsened recently and making it hard to walk.  Patient's not been seen almost 3 years   ROS      Objective:  Physical Exam  Neurovascular status intact with patient found to have some enlargement around the base of the fifth metatarsal left with the possibility of a cyst versus an inflammatory tendinitis at insertion     Assessment:  Possibility of peroneal tendinitis versus possibility ganglionic cyst     Plan:  H&P x-rays reviewed and today I did a sterile anesthetic block of the entire area and using sterile technique I did aspirate I was unable to get out any gelatinous fluid I then went ahead and injected with half cc dexamethasone Kenalog to reduce inflammation swelling and this should take care of her problem.  She will be seen back as symptoms indicate  X-rays indicate that there is no signs of calcification or bony injury surrounding the base of fifth metatarsal left

## 2021-04-10 ENCOUNTER — Encounter: Payer: Self-pay | Admitting: Internal Medicine

## 2021-05-26 ENCOUNTER — Ambulatory Visit (AMBULATORY_SURGERY_CENTER): Payer: Managed Care, Other (non HMO) | Admitting: *Deleted

## 2021-05-26 ENCOUNTER — Other Ambulatory Visit: Payer: Self-pay

## 2021-05-26 ENCOUNTER — Encounter: Payer: Self-pay | Admitting: Internal Medicine

## 2021-05-26 VITALS — Ht 66.0 in | Wt 222.0 lb

## 2021-05-26 DIAGNOSIS — Z1211 Encounter for screening for malignant neoplasm of colon: Secondary | ICD-10-CM

## 2021-05-26 DIAGNOSIS — Z800 Family history of malignant neoplasm of digestive organs: Secondary | ICD-10-CM

## 2021-05-26 MED ORDER — NA SULFATE-K SULFATE-MG SULF 17.5-3.13-1.6 GM/177ML PO SOLN: 1.0000 | ORAL | 0 refills | Status: DC

## 2021-05-26 NOTE — Progress Notes (Signed)
Patient's pre-visit was done today over the phone with the patient. Name,DOB and address verified. Patient denies any allergies to Eggs and Soy. Patient denies any problems with anesthesia/sedation. Patient is not taking any diet pills or blood thinners. No home Oxygen. Packet of Prep instructions mailed to patient including a copy of a consent form-pt is aware. Prep instructions sent to pt's MyChart (if activated).Patient understands to call us back with any questions or concerns. Patient is aware of our care-partner policy and [id number] safety protocol.   EMMI education assigned to the patient for the procedure, sent to [place].

## 2021-06-08 ENCOUNTER — Ambulatory Visit (AMBULATORY_SURGERY_CENTER): Payer: Managed Care, Other (non HMO) | Admitting: Internal Medicine

## 2021-06-08 ENCOUNTER — Other Ambulatory Visit: Payer: Self-pay

## 2021-06-08 ENCOUNTER — Encounter: Payer: Self-pay | Admitting: Internal Medicine

## 2021-06-08 VITALS — BP 130/60 | HR 83 | Temp 99.1°F | Resp 15 | Ht 66.0 in | Wt 222.0 lb

## 2021-06-08 DIAGNOSIS — Z1211 Encounter for screening for malignant neoplasm of colon: Secondary | ICD-10-CM

## 2021-06-08 DIAGNOSIS — D125 Benign neoplasm of sigmoid colon: Secondary | ICD-10-CM | POA: Diagnosis not present

## 2021-06-08 DIAGNOSIS — D123 Benign neoplasm of transverse colon: Secondary | ICD-10-CM | POA: Diagnosis not present

## 2021-06-08 DIAGNOSIS — Z800 Family history of malignant neoplasm of digestive organs: Secondary | ICD-10-CM

## 2021-06-08 DIAGNOSIS — D124 Benign neoplasm of descending colon: Secondary | ICD-10-CM

## 2021-06-08 MED ORDER — SODIUM CHLORIDE 0.9 % IV SOLN: 500.0000 mL | Freq: Once | INTRAVENOUS | Status: DC

## 2021-06-08 NOTE — Patient Instructions (Signed)
Resume previous medications.  Diverticulosis.  7 polyps removed and sent to pathology.   Await results for final recommendations.  Handouts on findings given to patient.     YOU HAD AN ENDOSCOPIC PROCEDURE TODAY AT [facility]:   Refer to the procedure report that was given to you for any specific questions about what was found during the examination.  If the procedure report does not answer your questions, please call your gastroenterologist to clarify.  If you requested that your care partner not be given the details of your procedure findings, then the procedure report has been included in a sealed envelope for you to review at your convenience later.  YOU SHOULD EXPECT: Some feelings of bloating in the abdomen. Passage of more gas than usual.  Walking can help get rid of the air that was put into your GI tract during the procedure and reduce the bloating. If you had a lower endoscopy (such as a colonoscopy or flexible sigmoidoscopy) you may notice spotting of blood in your stool or on the toilet paper. If you underwent a bowel prep for your procedure, you may not have a normal bowel movement for a few days.  Please Note:  You might notice some irritation and congestion in your nose or some drainage.  This is from the oxygen used during your procedure.  There is no need for concern and it should clear up in a day or so.  SYMPTOMS TO REPORT IMMEDIATELY:  Following lower endoscopy (colonoscopy or flexible sigmoidoscopy):  Excessive amounts of blood in the stool  Significant tenderness or worsening of abdominal pains  Swelling of the abdomen that is new, acute  Fever of 100F or higher   For urgent or emergent issues, a gastroenterologist can be reached at any hour by calling [phone]. Do not use MyChart messaging for urgent concerns.    DIET:  We do recommend a small meal at first, but then you may proceed to your regular diet.  Drink plenty of fluids but you should  avoid alcoholic beverages for 24 hours.  ACTIVITY:  You should plan to take it easy for the rest of today and you should NOT DRIVE or use heavy machinery until tomorrow (because of the sedation medicines used during the test).    FOLLOW UP: Our staff will call the number listed on your records 48-72 hours following your procedure to check on you and address any questions or concerns that you may have regarding the information given to you following your procedure. If we do not reach you, we will leave a message.  We will attempt to reach you two times.  During this call, we will ask if you have developed any symptoms of COVID 19. If you develop any symptoms (ie: fever, flu-like symptoms, shortness of breath, cough etc.) before then, please call [phone].  If you test positive for Covid 19 in the 2 weeks post procedure, please call and report this information to [place].    If any biopsies were taken you will be contacted by phone or by letter within the next 1-3 weeks.  Please call us at [phone] if you have not heard about the biopsies in 3 weeks.    SIGNATURES/CONFIDENTIALITY: You and/or your care partner have signed paperwork which will be entered into your electronic medical record.  These signatures attest to the fact that that the information above on your After Visit Summary has been reviewed and is understood.  Full responsibility of  the confidentiality of this discharge information lies with you and/or your care-partner.

## 2021-06-08 NOTE — Progress Notes (Signed)
Report given to PACU, vss 

## 2021-06-08 NOTE — Progress Notes (Signed)
GASTROENTEROLOGY PROCEDURE H&P NOTE   Primary Care Physician: Shanon Rosser, PA-C    Reason for Procedure:   Screening for colon cancer   Plan:    colonoscopy  Patient is appropriate for endoscopic procedure(s) in the ambulatory ([place]) setting.  The nature of the procedure, as well as the risks, benefits, and alternatives were carefully and thoroughly reviewed with the patient. Ample time for discussion and questions allowed. The patient understood, was satisfied, and agreed to proceed.   Her sister had colorectal cancer at age 59    HPI: Leslie Hanna is a 50 y.o. female who presents for screening colonoscopy.  Medical history as below.  Tolerated the prep.  No recent chest pain or shortness of breath.  No abdominal pain today.  Past Medical History:  Diagnosis Date   Barrett esophagus    Diabetes mellitus without complication (HCC)    Hyperlipidemia    Hypertension    Post-operative nausea and vomiting    post op headache    Past Surgical History:  Procedure Laterality Date   UPPER GASTROINTESTINAL ENDOSCOPY     10 years ago -[place]    Prior to Admission medications   Medication Sig Start Date End Date Taking? Authorizing Provider  dapagliflozin propanediol (FARXIGA) 10 MG TABS tablet Farxiga 10 mg tablet   Yes [provider]  DULoxetine (CYMBALTA) 20 MG capsule Take 20 mg by mouth daily.   Yes [provider]  lisinopril (ZESTRIL) 10 MG tablet Take 10 mg by mouth daily.   Yes [provider]  metFORMIN (GLUCOPHAGE) 1000 MG tablet Take 1,000 mg by mouth 2 (two) times daily. 09/17/19  Yes [provider]  omeprazole (PRILOSEC) 20 MG capsule Take 20 mg by mouth daily.   Yes [provider]  rosuvastatin (CRESTOR) 10 MG tablet Take 10 mg by mouth daily.   Yes [provider]  TURMERIC PO Take by mouth.   Yes [provider]    Current Outpatient Medications  Medication Sig Dispense Refill    dapagliflozin propanediol (FARXIGA) 10 MG TABS tablet Farxiga 10 mg tablet     DULoxetine (CYMBALTA) 20 MG capsule Take 20 mg by mouth daily.     lisinopril (ZESTRIL) 10 MG tablet Take 10 mg by mouth daily.     metFORMIN (GLUCOPHAGE) 1000 MG tablet Take 1,000 mg by mouth 2 (two) times daily.     omeprazole (PRILOSEC) 20 MG capsule Take 20 mg by mouth daily.     rosuvastatin (CRESTOR) 10 MG tablet Take 10 mg by mouth daily.     TURMERIC PO Take by mouth.     Current Facility-Administered Medications  Medication Dose Route Frequency Provider Last Rate Last Admin   0.9 %  sodium chloride infusion  500 mL Intravenous Once Gervis Gaba, Lajuan Lines, MD       triamcinolone acetonide (KENALOG) 10 MG/ML injection 10 mg  10 mg Other Once Regal, Norman S, DPM        Allergies as of 06/08/2021   (No Known Allergies)    Family History  Problem Relation Age of Onset   Colon polyps Sister    Colon cancer Sister 67   Cancer Paternal Aunt        breast   Cancer Paternal Aunt        breast   Cancer Paternal Aunt        breast    Cancer Maternal Grandfather        lung  Social History   Socioeconomic History   Marital status: Married    Spouse name: Not on file   Number of children: Not on file   Years of education: Not on file   Highest education level: Not on file  Occupational History   Not on file  Tobacco Use   Smoking status: Former   Smokeless tobacco: Never  Vaping Use   Vaping Use: Never used  Substance and Sexual Activity   Alcohol use: No   Drug use: No   Sexual activity: Not on file  Other Topics Concern   Not on file  Social History Narrative   Not on file   Social Determinants of Health   Financial Resource Strain: Not on file  Food Insecurity: Not on file  Transportation Needs: Not on file  Physical Activity: Not on file  Stress: Not on file  Social Connections: Not on file  Intimate Partner Violence: Not on file    Physical Exam: Vital signs in last 24  hours: @BP  (!) 161/84    Pulse 93    Temp 99.1 F (37.3 C) (Skin)    Ht 5\' 6"  (1.676 m)    Wt 222 lb (100.7 kg)    SpO2 96%    BMI 35.83 kg/m  GEN: NAD EYE: Sclerae anicteric ENT: MMM CV: Non-tachycardic Pulm: CTA b/l GI: Soft, NT/ND NEURO:  Alert & Oriented x 3   Zenovia Jarred, MD [facility]  06/08/2021 9:16 AM

## 2021-06-08 NOTE — Progress Notes (Signed)
Pt's states no medical or surgical changes since previsit or office visit. VS assessed by S.M 

## 2021-06-08 NOTE — Progress Notes (Signed)
Called to room to assist during endoscopic procedure.  Patient ID and intended procedure confirmed with present staff. Received instructions for my participation in the procedure from the performing physician.  

## 2021-06-08 NOTE — Op Note (Signed)
[place] Patient Name: Leslie Hanna Procedure Date: 06/08/2021 9:18 AM MRN: [id number] Endoscopist: Jerene Bears , MD Age: 50 Referring MD:  Date of Birth: 1970/12/11 Gender: Female Account #: [id number] Procedure:                Colonoscopy Indications:              Screening in patient at increased risk: Family                            history of 1st-degree relative with colorectal                            cancer before age 59 years (sister age 51), This is                            the patient's first colonoscopy Medicines:                Monitored Anesthesia Care Procedure:                Pre-Anesthesia Assessment:                           - Prior to the procedure, a History and Physical                            was performed, and patient medications and                            allergies were reviewed. The patient's tolerance of                            previous anesthesia was also reviewed. The risks                            and benefits of the procedure and the sedation                            options and risks were discussed with the patient.                            All questions were answered, and informed consent                            was obtained. Prior Anticoagulants: The patient has                            taken no previous anticoagulant or antiplatelet                            agents. ASA Grade Assessment: II - A patient with                            mild systemic disease. After reviewing the risks  and benefits, the patient was deemed in                            satisfactory condition to undergo the procedure.                           After obtaining informed consent, the colonoscope                            was passed under direct vision. Throughout the                            procedure, the patient's blood pressure, pulse, and                            oxygen saturations were  monitored continuously. The                            Olympus CF-HQ190L ([id number]) Colonoscope was                            introduced through the anus and advanced to the                            cecum, identified by appendiceal orifice and                            ileocecal valve. The colonoscopy was performed                            without difficulty. The patient tolerated the                            procedure well. The quality of the bowel                            preparation was good. The ileocecal valve,                            appendiceal orifice, and rectum were photographed. Scope In: 9:29:57 AM Scope Out: 9:49:07 AM Scope Withdrawal Time: 0 hours 16 minutes 27 seconds  Total Procedure Duration: 0 hours 19 minutes 10 seconds  Findings:                 The perianal and digital rectal examinations were                            normal.                           Four sessile polyps were found in the transverse                            colon. The polyps were 4 to 6 mm in size. These  polyps were removed with a cold snare. Resection                            and retrieval were complete.                           Two sessile polyps were found in the descending                            colon. The polyps were 4 to 5 mm in size. These                            polyps were removed with a cold snare. Resection                            and retrieval were complete.                           A 5 mm polyp was found in the sigmoid colon. The                            polyp was sessile. The polyp was removed with a                            cold snare. Resection and retrieval were complete.                           Multiple small and large-mouthed diverticula were                            found in the sigmoid colon and descending colon.                           The retroflexed view of the distal rectum and anal                             verge was normal and showed no anal or rectal                            abnormalities. Complications:            No immediate complications. Estimated Blood Loss:     Estimated blood loss was minimal. Impression:               - Four 4 to 6 mm polyps in the transverse colon,                            removed with a cold snare. Resected and retrieved.                           - Two 4 to 5 mm polyps in the descending colon,  removed with a cold snare. Resected and retrieved.                           - One 5 mm polyp in the sigmoid colon, removed with                            a cold snare. Resected and retrieved.                           - Diverticulosis in the sigmoid colon and in the                            descending colon.                           - The distal rectum and anal verge are normal on                            retroflexion view. Recommendation:           - Patient has a contact number available for                            emergencies. The signs and symptoms of potential                            delayed complications were discussed with the                            patient. Return to normal activities tomorrow.                            Written discharge instructions were provided to the                            patient.                           - Resume previous diet.                           - Continue present medications.                           - Await pathology results.                           - Repeat colonoscopy is recommended for                            surveillance. The colonoscopy date will be                            determined after pathology results from today's  exam become available for review. Jerene Bears, MD 06/08/2021 9:52:31 AM This report has been signed electronically.

## 2021-06-13 ENCOUNTER — Telehealth: Payer: Self-pay

## 2021-06-13 NOTE — Telephone Encounter (Signed)
°  Follow up Call-  Call back number 06/08/2021  Post procedure Call Back phone  # [phone]  Permission to leave phone message Yes  Some recent data might be hidden     Patient questions:  Do you have a fever, pain , or abdominal swelling? No. Pain Score  0 *  Have you tolerated food without any problems? Yes.    Have you been able to return to your normal activities? Yes.    Do you have any questions about your discharge instructions: Diet   No. Medications  No. Follow up visit  No.  Do you have questions or concerns about your Care? No.  Actions: * If pain score is 4 or above: No action needed, pain <4.  Have you developed a fever since your procedure? no  2.   Have you had an respiratory symptoms (SOB or cough) since your procedure? no  3.   Have you tested positive for COVID 19 since your procedure no  4.   Have you had any family members/close contacts diagnosed with the COVID 19 since your procedure?  no   If yes to any of these questions please route to Joylene John, RN and Joella Prince, RN

## 2021-06-20 ENCOUNTER — Encounter: Payer: Self-pay | Admitting: Internal Medicine

## 2021-09-11 NOTE — Progress Notes (Signed)
? ?ID:  Leslie Hanna, DOB Oct 17, 1970, MRN [id number] ? ?PCP:  Shanon Rosser, PA-C  ?Cardiologist:  Rex Kras, DO, [facility]  (established care 09/12/2021) ? ?REASON FOR CONSULT: Palpitations ? ?REQUESTING PHYSICIAN:  ?Shanon Rosser, PA-C ?[address] ?[place],  [place] [id number] ? ?Chief Complaint  ?Patient presents with  ? Palpitations  ? New Patient (Initial Visit)  ? Chest Pain  ? ? ?HPI  ?Leslie Hanna is a 51 y.o. Caucasian female who presents to the office with a chief complaint of " palpitations and chest pain." Patient's past medical history and cardiovascular risk factors include: Non-insulin-dependent diabetes w/ neuropathy, hypertension, hyperlipidemia, former smoker.  ? ?She is referred to the office at the request of Long, Nicki Reaper, PA-C for evaluation of palpitations. ? ?Patient is accompanied by her mother at today's office visit. ? ?Palpitations: ?Index event happened while she was at work approximately 1 week ago doing her daily duties, symptoms were intermittent, lasted for a few minutes, no improving or worsening factors, no near-syncope or syncopal event.  She had another episode the following day and since then she has been symptom-free. ? ?Precordial discomfort: ?Initially noted at the same time when she had her initial palpitations, located over the left precordium, nonexertional, it did not resolve with rest, dull-like sensation, constant, intensity 2 out of 10, lasted for few hours.  No recurrence of chest discomfort. ? ?Cardiovascular risk factors are hypertension, hyperlipidemia, uncontrolled diabetes with A1c of 10, former smoker and given her symptoms of palpitations and precordial discomfort is referred to cardiology for further evaluation and management. ? ?FUNCTIONAL STATUS: ?No structured exercise program or daily routine.  ? ?ALLERGIES: ?No Known Allergies ? ?MEDICATION LIST PRIOR TO VISIT: ?Current Meds  ?Medication Sig  ? dapagliflozin propanediol (FARXIGA) 10 MG TABS tablet  Farxiga 10 mg tablet  ? DULoxetine (CYMBALTA) 60 MG capsule Take 20 mg by mouth daily.  ? lisinopril (ZESTRIL) 10 MG tablet Take 10 mg by mouth daily.  ? metFORMIN (GLUCOPHAGE) 1000 MG tablet Take 1,000 mg by mouth daily with breakfast.  ? omeprazole (PRILOSEC) 20 MG capsule Take 20 mg by mouth daily.  ? rosuvastatin (CRESTOR) 10 MG tablet Take 10 mg by mouth daily.  ? TURMERIC PO Take by mouth.  ? ?Current Facility-Administered Medications for the 09/12/21 encounter (Office Visit) with Rex Kras, DO  ?Medication  ? 0.9 %  sodium chloride infusion  ? triamcinolone acetonide (KENALOG) 10 MG/ML injection 10 mg  ?  ? ?PAST MEDICAL HISTORY: ?Past Medical History:  ?Diagnosis Date  ? Barrett esophagus   ? Diabetes mellitus without complication ([place])   ? Hyperlipidemia   ? Hypertension   ? Post-operative nausea and vomiting   ? post op headache  ? ? ?PAST SURGICAL HISTORY: ?Past Surgical History:  ?Procedure Laterality Date  ? UPPER GASTROINTESTINAL ENDOSCOPY    ? 10 years ago -[employer],   ? ? ?FAMILY HISTORY: ?The patient family history includes Alzheimer's disease in her father; Cancer in her maternal grandfather, paternal aunt, paternal aunt, and paternal aunt; Colon cancer (age of onset: 82) in her sister; Colon polyps in her sister. ? ?SOCIAL HISTORY:  ?The patient  reports that she quit smoking about 8 years ago. Her smoking use included cigarettes. She has a 33.00 pack-year smoking history. She has never used smokeless tobacco. She reports that she does not drink alcohol and does not use drugs. ? ?REVIEW OF SYSTEMS: ?Review of Systems  ?Cardiovascular:  Positive for chest pain (now resolved.) and palpitations (  now resolved). Negative for dyspnea on exertion, leg swelling, orthopnea, paroxysmal nocturnal dyspnea and syncope.  ? ?PHYSICAL EXAM: ? ?  09/12/2021  ?  8:47 AM 06/08/2021  ? 10:12 AM 06/08/2021  ? 10:02 AM  ?Vitals with BMI  ?Height 5' 6"     ?Weight 222 lbs 3 oz    ?BMI 35.88    ?Systolic [phone]   ?Diastolic 85 60 70  ?Pulse 86 83 84  ? ? ?CONSTITUTIONAL: Well-developed and well-nourished. No acute distress.  ?SKIN: Skin is warm and dry. No rash noted. No cyanosis. No pallor. No jaundice ?HEAD: Normocephalic and atraumatic.  ?EYES: No scleral icterus ?MOUTH/THROAT: Moist oral membranes.  ?NECK: No JVD present. No thyromegaly noted. No carotid bruits  ?LYMPHATIC: No visible cervical adenopathy.  ?CHEST Normal respiratory effort. No intercostal retractions  ?LUNGS: Clear to auscultation bilaterally.  No stridor. No wheezes. No rales.  ?CARDIOVASCULAR: Regular rate and rhythm, positive S1-S2, no murmurs rubs or gallops appreciated. ?ABDOMINAL: Soft, nontender, nondistended, positive bowel sounds in all 4 quadrants, no apparent ascites.  ?EXTREMITIES: No peripheral edema, warm to touch, 2+ bilateral DP and PT pulses ?HEMATOLOGIC: No significant bruising ?NEUROLOGIC: Oriented to person, place, and time. Nonfocal. Normal muscle tone.  ?PSYCHIATRIC: Normal mood and affect. Normal behavior. Cooperative ? ?CARDIAC DATABASE: ?EKG: ?09/12/2021: NSR, 89 bpm, PRWP, without underlying injury pattern.  ? ?Echocardiogram: ?No results found for this or any previous visit from the past 1095 days. ?  ? ?Stress Testing: ?No results found for this or any previous visit from the past 1095 days. ? ? ?Heart Catheterization: ?None ? ?LABORATORY DATA: ?External Labs:  ?Date Collected: 08/31/2021 , information obtained by referring physician ?Potassium: 4.3 ?Creatinine 0.83 mg/dL. ?eGFR: 86 mL/min per 1.73 m? ?Hemoglobin: 15.3 g/dL and hematocrit: 46.1 % ?AST: 19 , ALT: 24 , alkaline phosphatase: 129  ?Hemoglobin A1c: 10.8 ?TSH: 1.75   ? ?Collected 12/10/2018 available in Care Everywhere: ?Total cholesterol 163, triglycerides 134, HDL 45, LDL 95, non-HDL 118 ? ?IMPRESSION: ? ?  ICD-10-CM   ?1. Palpitations  R00.2 EKG 12-Lead  ?  ?2. Benign hypertension  I10 PCV ECHOCARDIOGRAM COMPLETE  ?  ?3. Type 2 diabetes mellitus with hyperglycemia,  without long-term current use of insulin (HCC)  E11.65 PCV ECHOCARDIOGRAM COMPLETE  ?  CT CARDIAC SCORING ([facility] LOCATIONS ONLY)  ?  Lipid Panel With LDL/HDL Ratio  ?  LDL cholesterol, direct  ?  CMP14+EGFR  ?  ?4. Type 2 diabetes mellitus with hyperlipidemia (HCC)  E11.69 PCV ECHOCARDIOGRAM COMPLETE  ? E78.5 CT CARDIAC SCORING ([facility] LOCATIONS ONLY)  ?  Lipid Panel With LDL/HDL Ratio  ?  LDL cholesterol, direct  ?  CMP14+EGFR  ?  ?5. Precordial pain  R07.2 PCV ECHOCARDIOGRAM COMPLETE  ?  CT CARDIAC SCORING ([facility] LOCATIONS ONLY)  ?  ?  ? ?RECOMMENDATIONS: ?Leslie Hanna is a 51 y.o. Caucasian female whose past medical history and cardiac risk factors include: Non-insulin-dependent diabetes w/ neuropathy, hypertension, hyperlipidemia, former smoker.  ? ?Palpitations ?Resolved for now. ?Her symptoms of palpitations are not frequent enough to proceed with extended Holter monitor as the overall yield will be low. ?However, if she has additional symptoms of palpitation she is asked to call the office and we can certainly arrange a Holter monitor if and when clinically indicated. ?Hemoglobin within normal limits. ?TSH within normal limits. ?EKG shows normal sinus rhythm without any dysrhythmias. ?No identifiable reversible cause. ? ?Precordial pain ?Symptoms suggestive of noncardiac etiology. ?EKG ordered and independently reviewed-normal  sinus without underlying ischemia injury pattern. ?Estimated 10-year risk of ASCVD 6.4%, multiple cardiovascular risk factors such as non-insulin-dependent diabetes (not well controlled A1c last reported to be 10.6), hyperlipidemia, former smoker. ?Echo will be ordered to evaluate for structural heart disease and left ventricular systolic function. ?Coronary calcium score to further risk stratify the patient.  We will order a stress test based on her CAC score.EKG ordered and independently reviewed. ? ?Benign hypertension ?Blood pressure well controlled. ?Currently on medical  therapy. ? ?Type 2 diabetes mellitus with hyperglycemia, without long-term current use of insulin ([place]) ?Most recent hemoglobin A1c not well controlled. ?Currently on medical therapy : Farxiga, metformin, Crestor.

## 2021-09-12 ENCOUNTER — Other Ambulatory Visit: Payer: Self-pay

## 2021-09-12 ENCOUNTER — Ambulatory Visit: Payer: Managed Care, Other (non HMO) | Admitting: Cardiology

## 2021-09-12 ENCOUNTER — Encounter: Payer: Self-pay | Admitting: Cardiology

## 2021-09-12 VITALS — BP 127/85 | HR 86 | Temp 98.7°F | Resp 16 | Ht 66.0 in | Wt 222.2 lb

## 2021-09-12 DIAGNOSIS — I10 Essential (primary) hypertension: Secondary | ICD-10-CM

## 2021-09-12 DIAGNOSIS — R002 Palpitations: Secondary | ICD-10-CM

## 2021-09-12 DIAGNOSIS — R072 Precordial pain: Secondary | ICD-10-CM

## 2021-09-12 DIAGNOSIS — E1165 Type 2 diabetes mellitus with hyperglycemia: Secondary | ICD-10-CM

## 2021-09-12 DIAGNOSIS — E1169 Type 2 diabetes mellitus with other specified complication: Secondary | ICD-10-CM

## 2021-09-21 ENCOUNTER — Other Ambulatory Visit: Payer: Managed Care, Other (non HMO)

## 2021-09-21 LAB — CMP14+EGFR
ALT: 27 IU/L (ref 0–32)
AST: 22 IU/L (ref 0–40)
Albumin/Globulin Ratio: 1.7 (ref 1.2–2.2)
Albumin: 4.4 g/dL (ref 3.8–4.8)
Alkaline Phosphatase: 120 IU/L (ref 44–121)
BUN/Creatinine Ratio: 16 (ref 9–23)
BUN: 13 mg/dL (ref 6–24)
Bilirubin Total: 0.3 mg/dL (ref 0.0–1.2)
CO2: 19 mmol/L — ABNORMAL LOW (ref 20–29)
Calcium: 9.3 mg/dL (ref 8.7–10.2)
Chloride: 98 mmol/L (ref 96–106)
Creatinine, Ser: 0.83 mg/dL (ref 0.57–1.00)
Globulin, Total: 2.6 g/dL (ref 1.5–4.5)
Glucose: 172 mg/dL — ABNORMAL HIGH (ref 70–99)
Potassium: 3.9 mmol/L (ref 3.5–5.2)
Sodium: 134 mmol/L (ref 134–144)
Total Protein: 7 g/dL (ref 6.0–8.5)
eGFR: 86 mL/min/{1.73_m2} (ref >59)

## 2021-09-21 LAB — LDL CHOLESTEROL, DIRECT: LDL Direct: 77 mg/dL (ref 0–99)

## 2021-09-21 LAB — LIPID PANEL WITH LDL/HDL RATIO
Cholesterol, Total: 137 mg/dL (ref 100–199)
HDL: 35 mg/dL — ABNORMAL LOW (ref >39)
LDL Chol Calc (NIH): 85 mg/dL (ref 0–99)
LDL/HDL Ratio: 2.4 ratio (ref 0.0–3.2)
Triglycerides: 86 mg/dL (ref 0–149)
VLDL Cholesterol Cal: 17 mg/dL (ref 5–40)

## 2021-09-26 ENCOUNTER — Ambulatory Visit: Payer: Managed Care, Other (non HMO)

## 2021-09-26 DIAGNOSIS — E1169 Type 2 diabetes mellitus with other specified complication: Secondary | ICD-10-CM

## 2021-09-26 DIAGNOSIS — I10 Essential (primary) hypertension: Secondary | ICD-10-CM

## 2021-09-26 DIAGNOSIS — E1165 Type 2 diabetes mellitus with hyperglycemia: Secondary | ICD-10-CM

## 2021-09-26 DIAGNOSIS — R072 Precordial pain: Secondary | ICD-10-CM

## 2021-10-10 ENCOUNTER — Ambulatory Visit: Payer: Managed Care, Other (non HMO) | Admitting: Cardiology

## 2021-10-10 ENCOUNTER — Ambulatory Visit
Admission: RE | Admit: 2021-10-10 | Discharge: 2021-10-10 | Disposition: A | Discharge status: Home / Self Care | Payer: No Typology Code available for payment source | Source: Ambulatory Visit | Attending: Cardiology | Admitting: Cardiology

## 2021-10-10 DIAGNOSIS — E1169 Type 2 diabetes mellitus with other specified complication: Secondary | ICD-10-CM

## 2021-10-10 DIAGNOSIS — R072 Precordial pain: Secondary | ICD-10-CM

## 2021-10-10 DIAGNOSIS — E1165 Type 2 diabetes mellitus with hyperglycemia: Secondary | ICD-10-CM

## 2021-10-19 ENCOUNTER — Other Ambulatory Visit: Payer: Self-pay | Admitting: Cardiology

## 2021-10-19 DIAGNOSIS — R072 Precordial pain: Secondary | ICD-10-CM

## 2021-10-19 DIAGNOSIS — I2510 Atherosclerotic heart disease of native coronary artery without angina pectoris: Secondary | ICD-10-CM

## 2021-10-19 DIAGNOSIS — E785 Hyperlipidemia, unspecified: Secondary | ICD-10-CM

## 2021-10-19 DIAGNOSIS — E1165 Type 2 diabetes mellitus with hyperglycemia: Secondary | ICD-10-CM

## 2021-10-20 NOTE — Progress Notes (Signed)
Called pt an d was transferred to the front to make an appt for stress test. Pt [id number]

## 2021-10-27 ENCOUNTER — Ambulatory Visit: Payer: Managed Care, Other (non HMO) | Admitting: Cardiology

## 2021-11-01 ENCOUNTER — Ambulatory Visit: Payer: Managed Care, Other (non HMO)

## 2021-11-01 DIAGNOSIS — R072 Precordial pain: Secondary | ICD-10-CM

## 2021-11-01 DIAGNOSIS — E785 Hyperlipidemia, unspecified: Secondary | ICD-10-CM

## 2021-11-01 DIAGNOSIS — E1165 Type 2 diabetes mellitus with hyperglycemia: Secondary | ICD-10-CM

## 2021-11-01 DIAGNOSIS — I2510 Atherosclerotic heart disease of native coronary artery without angina pectoris: Secondary | ICD-10-CM

## 2021-11-01 DIAGNOSIS — E1169 Type 2 diabetes mellitus with other specified complication: Secondary | ICD-10-CM

## 2021-11-09 ENCOUNTER — Encounter: Payer: Self-pay | Admitting: Cardiology

## 2021-11-09 ENCOUNTER — Ambulatory Visit: Payer: Managed Care, Other (non HMO) | Admitting: Cardiology

## 2021-11-09 VITALS — BP 112/79 | HR 93 | Temp 98.6°F | Resp 16 | Ht 66.0 in | Wt 221.4 lb

## 2021-11-09 DIAGNOSIS — I2510 Atherosclerotic heart disease of native coronary artery without angina pectoris: Secondary | ICD-10-CM

## 2021-11-09 DIAGNOSIS — E1169 Type 2 diabetes mellitus with other specified complication: Secondary | ICD-10-CM

## 2021-11-09 DIAGNOSIS — E785 Hyperlipidemia, unspecified: Secondary | ICD-10-CM

## 2021-11-09 DIAGNOSIS — E1165 Type 2 diabetes mellitus with hyperglycemia: Secondary | ICD-10-CM

## 2021-11-09 DIAGNOSIS — R931 Abnormal findings on diagnostic imaging of heart and coronary circulation: Secondary | ICD-10-CM

## 2021-11-09 DIAGNOSIS — I10 Essential (primary) hypertension: Secondary | ICD-10-CM

## 2021-11-09 MED ORDER — ROSUVASTATIN CALCIUM 20 MG PO TABS: 20.0000 mg | ORAL_TABLET | Freq: Every evening | ORAL | 0 refills | Status: DC

## 2021-11-09 MED ORDER — ASPIRIN 81 MG PO TBEC: 81.0000 mg | DELAYED_RELEASE_TABLET | Freq: Every day | ORAL | 12 refills | Status: DC

## 2021-11-09 NOTE — Progress Notes (Signed)
ID:  Leslie Hanna, DOB [birth year], MRN [id number]  PCP:  Shanon Rosser, PA-C  Cardiologist:  Rex Kras, DO, [facility]  (established care 09/12/2021)  Date: 11/09/21 Last Office Visit: 09/12/2021  Chief Complaint  Patient presents with   Follow-up    Reevaluation of chest pain and discuss test results   HPI  Leslie Hanna is a 50 y.o. Caucasian female whose past medical history and cardiovascular risk factors include: Severe coronary artery calcification (total CAC 607, 99th percentile), non-insulin-dependent diabetes w/ neuropathy, hypertension, hyperlipidemia, former smoker.   Patient is accompanied by her mother at today's office visit.  Originally referred to the practice for evaluation of palpitations but by the time the appointment was made her symptoms had resolved and therefore the shared decision was to hold off on additional work-up.   However, at the last visit she was complaining of precordial discomfort which appear to be noncardiac based on symptoms.  However, given her estimated 10-year risk of ASCVD, uncontrolled non-insulin-dependent diabetes mellitus type 2, hyperlipidemia and former smoker this shared decision was to proceed with coronary artery calcium score for risk stratification.  She was noted to have severe CAC with a total score of 607 placing her at the 99th percentile.  She subsequently underwent nuclear stress test which noted low functional capacity for age, small sized, moderate intensity reversible perfusion defect in the apical regions likely suggestive of possible breast attenuation artifact, the study was reported to be low risk.  Clinically she denies angina pectoris or heart failure symptoms since the last office visit.  FUNCTIONAL STATUS: No structured exercise program or daily routine.   ALLERGIES: No Known Allergies  MEDICATION LIST PRIOR TO VISIT: Current Meds  Medication Sig   aspirin EC 81 MG tablet Take 1 tablet (81 mg total) by mouth  daily. Swallow whole.   dapagliflozin propanediol (FARXIGA) 10 MG TABS tablet Farxiga 10 mg tablet   DULoxetine (CYMBALTA) 60 MG capsule Take 20 mg by mouth daily.   lisinopril (ZESTRIL) 10 MG tablet Take 10 mg by mouth daily.   metFORMIN (GLUCOPHAGE) 1000 MG tablet Take 1,000 mg by mouth daily with breakfast.   omeprazole (PRILOSEC) 20 MG capsule Take 20 mg by mouth daily.   TURMERIC PO Take by mouth.   [DISCONTINUED] rosuvastatin (CRESTOR) 10 MG tablet Take 10 mg by mouth daily.     PAST MEDICAL HISTORY: Past Medical History:  Diagnosis Date   Barrett esophagus    Diabetes mellitus without complication (HCC)    Hyperlipidemia    Hypertension    Post-operative nausea and vomiting    post op headache    PAST SURGICAL HISTORY: Past Surgical History:  Procedure Laterality Date   UPPER GASTROINTESTINAL ENDOSCOPY     10 years ago -[place], [place]    FAMILY HISTORY: The patient family history includes Alzheimer's disease in her father and maternal grandmother; Cancer in her maternal grandfather, paternal aunt, paternal aunt, and paternal aunt; Colon cancer (age of onset: 30) in her sister; Colon polyps in her sister; Diabetes in her father; Emphysema in her paternal grandfather; Heart attack in her maternal grandmother; Hyperlipidemia in her mother; Hypertension in her mother; Pneumonia in her daughter; Stroke in her maternal grandmother; Thyroid disease in her mother.  SOCIAL HISTORY:  The patient  reports that she quit smoking about 8 years ago. Her smoking use included cigarettes. She has a 33.00 pack-year smoking history. She has never used smokeless tobacco. She reports that she does not drink alcohol and does  not use drugs.  REVIEW OF SYSTEMS: Review of Systems  Cardiovascular:  Negative for chest pain, dyspnea on exertion, leg swelling, near-syncope, orthopnea, palpitations, paroxysmal nocturnal dyspnea and syncope.  Respiratory:  Negative for shortness of breath.    PHYSICAL  EXAM:    11/09/2021    2:12 PM 09/12/2021    8:47 AM 06/08/2021   10:12 AM  Vitals with BMI  Height 5' 6" 5' 6"   Weight 221 lbs 6 oz 222 lbs 3 oz   BMI 13.08 65.78   Systolic 469 629 528  Diastolic 79 85 60  Pulse 93 86 83    CONSTITUTIONAL: Well-developed and well-nourished. No acute distress.  SKIN: Skin is warm and dry. No rash noted. No cyanosis. No pallor. No jaundice HEAD: Normocephalic and atraumatic.  EYES: No scleral icterus MOUTH/THROAT: Moist oral membranes.  NECK: No JVD present. No thyromegaly noted. No carotid bruits  CHEST Normal respiratory effort. No intercostal retractions  LUNGS: Clear to auscultation bilaterally.  No stridor. No wheezes. No rales.  CARDIOVASCULAR: Regular rate and rhythm, positive S1-S2, no murmurs rubs or gallops appreciated. ABDOMINAL: Soft, nontender, nondistended, positive bowel sounds in all 4 quadrants, no apparent ascites.  EXTREMITIES: No peripheral edema, warm to touch, 2+ bilateral DP and PT pulses HEMATOLOGIC: No significant bruising NEUROLOGIC: Oriented to person, place, and time. Nonfocal. Normal muscle tone.  PSYCHIATRIC: Normal mood and affect. Normal behavior. Cooperative  CARDIAC DATABASE: EKG: 09/12/2021: NSR, 89 bpm, PRWP, without underlying injury pattern.   Echocardiogram: 09/26/2021:  Left ventricle cavity is normal in size and wall thickness. Normal global wall motion. Normal LV systolic function with EF 59%. Normal diastolic filling pattern. No significant valvular abnormality.  Normal right atrial pressure.   Stress Testing: Exercise nuclear stress test 11/01/2021: The patient exercised for 3 minutes and 1 seconds of a Bruce protocol, achieving approximately 4.64 METs and 88% of age predicted maximum heart rate. Exercise capacity was low. No chest pain reported. Accelerated heart rate. Normal hemodynamic response. Stress EKG revealed no ischemic changes. There is a very small sized moderate reversible defect in the  apical region. This may also represent mild breast attenuation artifact.  Overall LV systolic function is normal without regional wall motion abnormalities. Stress LV EF: 61%.  No previous exam available for comparison. Low risk.   Heart Catheterization: None  LABORATORY DATA: External Labs:  Date Collected: 08/31/2021 , information obtained by referring physician Potassium: 4.3 Creatinine 0.83 mg/dL. eGFR: 86 mL/min per 1.73 m Hemoglobin: 15.3 g/dL and hematocrit: 46.1 % AST: 19 , ALT: 24 , alkaline phosphatase: 129  Hemoglobin A1c: 10.8 TSH: 1.75    Collected 12/10/2018 available in Care Everywhere: Total cholesterol 163, triglycerides 134, HDL 45, LDL 95, non-HDL 118  Lipid Panel     Component Value Date/Time   CHOL 137 09/20/2021 0851   TRIG 86 09/20/2021 0851   HDL 35 (L) 09/20/2021 0851   LDLCALC 85 09/20/2021 0851   LDLDIRECT 77 09/20/2021 0851   LABVLDL 17 09/20/2021 0851   IMPRESSION:    ICD-10-CM   1. Coronary atherosclerosis due to calcified coronary lesion  I25.10 aspirin EC 81 MG tablet   I25.84 rosuvastatin (CRESTOR) 20 MG tablet    Lipid Panel With LDL/HDL Ratio    LDL cholesterol, direct    CMP14+EGFR    2. Agatston CAC score, >400  R93.1 aspirin EC 81 MG tablet    rosuvastatin (CRESTOR) 20 MG tablet    Lipid Panel With LDL/HDL Ratio  LDL cholesterol, direct    CMP14+EGFR    3. Benign hypertension  I10     4. Type 2 diabetes mellitus with hyperglycemia, without long-term current use of insulin (HCC)  E11.65     5. Type 2 diabetes mellitus with hyperlipidemia (HCC)  E11.69 rosuvastatin (CRESTOR) 20 MG tablet   E78.5 Lipid Panel With LDL/HDL Ratio    LDL cholesterol, direct    CMP14+EGFR    6. Class 2 severe obesity due to excess calories with serious comorbidity and body mass index (BMI) of 35.0 to 35.9 in adult [facility])  E66.01    Z68.35         RECOMMENDATIONS: Leslie Hanna is a 51 y.o. Caucasian female whose past medical history and  cardiac risk factors include: Non-insulin-dependent diabetes w/ neuropathy, hypertension, hyperlipidemia, former smoker.   Coronary atherosclerosis due to calcified coronary lesion / Agatston CAC score, >400 Total CAC 607, 99th percentile Start aspirin 81 mg p.o. daily. LDL levels are still greater than 70 mg/dL, increase rosuvastatin to 20 mg p.o. nightly. Reviewed the results of the echocardiogram, nuclear stress test, and coronary calcium score with the patient and her mom at today's visit. She is not having anginal discomfort and therefore would recommend focusing on improving her modifiable cardiovascular risk factors.  Patient understands the importance of glycemic control, lipid and blood pressure management. Recommend increasing physical activity as tolerated with a goal of 30 minutes a day 5 days a week.  Benign hypertension Office blood pressures are very well controlled. Continue current medical therapy.  Type 2 diabetes mellitus with hyperglycemia, without long-term current use of insulin (HCC) Last hemoglobin A1c 10.8 Education importance of glycemic control given the findings of severe CAC Currently on Farxiga, metformin, Crestor  Type 2 diabetes mellitus with hyperlipidemia ([place]) Currently on Crestor.   She denies myalgia or other side effects. Most recent lipids dated September 20, 2021, independently reviewed as noted above. Given her diabetes and severe CAC recommend better lipid management.  Current LDL is greater than 70 mg/dL Increase Crestor to 20 mg p.o. nightly. Labs in 6 weeks to reevaluate lipids and LFTs. Currently managed by primary care provider.  Class 2 severe obesity due to excess calories with serious comorbidity and body mass index (BMI) of 35.0 to 35.9 in adult [facility]) Body mass index is 35.73 kg/m. I reviewed with the patient the importance of diet, regular physical activity/exercise, weight loss.   Patient is educated on increasing physical activity  gradually as tolerated.  With the goal of moderate intensity exercise for 30 minutes a day 5 days a week.  FINAL MEDICATION LIST END OF ENCOUNTER: Meds ordered this encounter  Medications   aspirin EC 81 MG tablet    Sig: Take 1 tablet (81 mg total) by mouth daily. Swallow whole.    Dispense:  30 tablet    Refill:  12   rosuvastatin (CRESTOR) 20 MG tablet    Sig: Take 1 tablet (20 mg total) by mouth at bedtime.    Dispense:  90 tablet    Refill:  0    Medications Discontinued During This Encounter  Medication Reason   0.9 %  sodium chloride infusion    triamcinolone acetonide (KENALOG) 10 MG/ML injection 10 mg    rosuvastatin (CRESTOR) 10 MG tablet Reorder     Current Outpatient Medications:    aspirin EC 81 MG tablet, Take 1 tablet (81 mg total) by mouth daily. Swallow whole., Disp: 30 tablet, Rfl: 12   dapagliflozin  propanediol (FARXIGA) 10 MG TABS tablet, Farxiga 10 mg tablet, Disp: , Rfl:    DULoxetine (CYMBALTA) 60 MG capsule, Take 20 mg by mouth daily., Disp: , Rfl:    lisinopril (ZESTRIL) 10 MG tablet, Take 10 mg by mouth daily., Disp: , Rfl:    metFORMIN (GLUCOPHAGE) 1000 MG tablet, Take 1,000 mg by mouth daily with breakfast., Disp: , Rfl:    omeprazole (PRILOSEC) 20 MG capsule, Take 20 mg by mouth daily., Disp: , Rfl:    TURMERIC PO, Take by mouth., Disp: , Rfl:    rosuvastatin (CRESTOR) 20 MG tablet, Take 1 tablet (20 mg total) by mouth at bedtime., Disp: 90 tablet, Rfl: 0  Orders Placed This Encounter  Procedures   Lipid Panel With LDL/HDL Ratio   LDL cholesterol, direct   CMP14+EGFR    There are no Patient Instructions on file for this visit.   --Continue cardiac medications as reconciled in final medication list. --Return in about 3 months (around 02/09/2022) for Follow up, Coronary artery calcification. Or sooner if needed. --Continue follow-up with your primary care physician regarding the management of your other chronic comorbid conditions.  Patient's  questions and concerns were addressed to her satisfaction. She voices understanding of the instructions provided during this encounter.   This note was created using a voice recognition software as a result there may be grammatical errors inadvertently enclosed that do not reflect the nature of this encounter. Every attempt is made to correct such errors.  Rex Kras, [place], [facility]  Pager: [phone] Office: [phone]

## 2021-12-20 ENCOUNTER — Other Ambulatory Visit: Payer: Self-pay | Admitting: Cardiology

## 2021-12-21 LAB — CMP14+EGFR
ALT: 17 IU/L (ref 0–32)
AST: 17 IU/L (ref 0–40)
Albumin/Globulin Ratio: 1.7 (ref 1.2–2.2)
Albumin: 4.3 g/dL (ref 3.8–4.8)
Alkaline Phosphatase: 104 IU/L (ref 44–121)
BUN/Creatinine Ratio: 19 (ref 9–23)
BUN: 16 mg/dL (ref 6–24)
Bilirubin Total: 0.3 mg/dL (ref 0.0–1.2)
CO2: 20 mmol/L (ref 20–29)
Calcium: 9.7 mg/dL (ref 8.7–10.2)
Chloride: 102 mmol/L (ref 96–106)
Creatinine, Ser: 0.83 mg/dL (ref 0.57–1.00)
Globulin, Total: 2.5 g/dL (ref 1.5–4.5)
Glucose: 157 mg/dL — ABNORMAL HIGH (ref 70–99)
Potassium: 4.2 mmol/L (ref 3.5–5.2)
Sodium: 136 mmol/L (ref 134–144)
Total Protein: 6.8 g/dL (ref 6.0–8.5)
eGFR: 86 mL/min/{1.73_m2} (ref >59)

## 2021-12-21 LAB — LIPID PANEL WITH LDL/HDL RATIO
Cholesterol, Total: 136 mg/dL (ref 100–199)
HDL: 41 mg/dL (ref >39)
LDL Chol Calc (NIH): 77 mg/dL (ref 0–99)
LDL/HDL Ratio: 1.9 ratio (ref 0.0–3.2)
Triglycerides: 94 mg/dL (ref 0–149)
VLDL Cholesterol Cal: 18 mg/dL (ref 5–40)

## 2021-12-21 LAB — LDL CHOLESTEROL, DIRECT: LDL Direct: 77 mg/dL (ref 0–99)

## 2021-12-21 NOTE — Progress Notes (Signed)
Tried calling patient no answer left a vm

## 2021-12-22 ENCOUNTER — Other Ambulatory Visit: Payer: Self-pay

## 2021-12-22 DIAGNOSIS — I2510 Atherosclerotic heart disease of native coronary artery without angina pectoris: Secondary | ICD-10-CM

## 2021-12-22 MED ORDER — EZETIMIBE 10 MG PO TABS: 10.0000 mg | ORAL_TABLET | Freq: Every day | ORAL | 0 refills | Status: DC

## 2021-12-22 NOTE — Progress Notes (Signed)
Called and spoke to patient medication has been sent and labs have been ordered and released

## 2022-01-17 ENCOUNTER — Other Ambulatory Visit: Payer: Self-pay | Admitting: Cardiology

## 2022-02-04 ENCOUNTER — Other Ambulatory Visit: Payer: Self-pay | Admitting: Cardiology

## 2022-02-04 DIAGNOSIS — I2510 Atherosclerotic heart disease of native coronary artery without angina pectoris: Secondary | ICD-10-CM

## 2022-02-04 DIAGNOSIS — R931 Abnormal findings on diagnostic imaging of heart and coronary circulation: Secondary | ICD-10-CM

## 2022-02-04 DIAGNOSIS — E1169 Type 2 diabetes mellitus with other specified complication: Secondary | ICD-10-CM

## 2022-02-08 LAB — CMP14+EGFR
ALT: 14 IU/L (ref 0–32)
AST: 19 IU/L (ref 0–40)
Albumin/Globulin Ratio: 1.6 (ref 1.2–2.2)
Albumin: 4.3 g/dL (ref 3.8–4.9)
Alkaline Phosphatase: 94 IU/L (ref 44–121)
BUN/Creatinine Ratio: 15 (ref 9–23)
BUN: 13 mg/dL (ref 6–24)
Bilirubin Total: 0.4 mg/dL (ref 0.0–1.2)
CO2: 19 mmol/L — ABNORMAL LOW (ref 20–29)
Calcium: 9.5 mg/dL (ref 8.7–10.2)
Chloride: 98 mmol/L (ref 96–106)
Creatinine, Ser: 0.84 mg/dL (ref 0.57–1.00)
Globulin, Total: 2.7 g/dL (ref 1.5–4.5)
Glucose: 161 mg/dL — ABNORMAL HIGH (ref 70–99)
Potassium: 4.7 mmol/L (ref 3.5–5.2)
Sodium: 137 mmol/L (ref 134–144)
Total Protein: 7 g/dL (ref 6.0–8.5)
eGFR: 84 mL/min/{1.73_m2} (ref >59)

## 2022-02-08 LAB — LIPID PANEL WITH LDL/HDL RATIO
Cholesterol, Total: 117 mg/dL (ref 100–199)
HDL: 46 mg/dL (ref >39)
LDL Chol Calc (NIH): 55 mg/dL (ref 0–99)
LDL/HDL Ratio: 1.2 ratio (ref 0.0–3.2)
Triglycerides: 81 mg/dL (ref 0–149)
VLDL Cholesterol Cal: 16 mg/dL (ref 5–40)

## 2022-02-08 LAB — LDL CHOLESTEROL, DIRECT: LDL Direct: 49 mg/dL (ref 0–99)

## 2022-02-09 ENCOUNTER — Encounter: Payer: Self-pay | Admitting: Cardiology

## 2022-02-09 ENCOUNTER — Ambulatory Visit: Payer: Managed Care, Other (non HMO) | Admitting: Cardiology

## 2022-02-09 VITALS — BP 113/66 | HR 89 | Resp 16 | Ht 66.0 in | Wt 217.4 lb

## 2022-02-09 DIAGNOSIS — I2510 Atherosclerotic heart disease of native coronary artery without angina pectoris: Secondary | ICD-10-CM

## 2022-02-09 DIAGNOSIS — E1169 Type 2 diabetes mellitus with other specified complication: Secondary | ICD-10-CM

## 2022-02-09 DIAGNOSIS — Z6835 Body mass index (BMI) 35.0-35.9, adult: Secondary | ICD-10-CM

## 2022-02-09 DIAGNOSIS — E1165 Type 2 diabetes mellitus with hyperglycemia: Secondary | ICD-10-CM

## 2022-02-09 DIAGNOSIS — R931 Abnormal findings on diagnostic imaging of heart and coronary circulation: Secondary | ICD-10-CM

## 2022-02-09 DIAGNOSIS — I10 Essential (primary) hypertension: Secondary | ICD-10-CM

## 2022-02-09 NOTE — Progress Notes (Signed)
ID:  Leslie Hanna, DOB 01-09-1971, MRN [id number]  PCP:  Shanon Rosser, PA-C  Cardiologist:  Rex Kras, DO, [facility]  (established care 09/12/2021)  Date: 02/09/22 Last Office Visit: 11/09/2021  Chief Complaint  Patient presents with   Coronary artery calcification   Follow-up   HPI  Leslie Hanna is a 51 y.o. Caucasian female whose past medical history and cardiovascular risk factors include: Severe coronary artery calcification (total CAC 607, 99th percentile), non-insulin-dependent diabetes w/ neuropathy, hypertension, hyperlipidemia, former smoker.   Referred to the practice for evaluation of palpitations by the time the appointment was made she was asymptomatic.  However she did have concerns for precordial discomfort and given her cardiovascular risk factors including poorly controlled diabetes that shared decision was to proceed with coronary calcium score.  She was noted to have severe CAC with a total score of 607, placing her at the 99th percentile.  She underwent nuclear stress test which noted a small size moderate intensity reversible perfusion defect at the apex suggestive of possible ischemia versus breast attenuation artifact.  Clinically she remains asymptomatic.  Given her severe coronary artery calcification and diabetes at the last office visit patient's statin therapy was uptitrated.  She had repeat labs which note significant improvement in LDL and LFTs remained stable.    Since last office visit she has been started on [facility] as well.  She is slowly increasing her physical activity-now walking 0.25 miles per day mostly limited by neuropathy.  She is lost approximately 4 pounds since last office visit.  FUNCTIONAL STATUS: No structured exercise program or daily routine.   ALLERGIES: No Known Allergies  MEDICATION LIST PRIOR TO VISIT: Current Meds  Medication Sig   aspirin EC 81 MG tablet Take 1 tablet (81 mg total) by mouth daily. Swallow whole.    dapagliflozin propanediol (FARXIGA) 10 MG TABS tablet Farxiga 10 mg tablet   DULoxetine (CYMBALTA) 60 MG capsule Take 20 mg by mouth daily.   ezetimibe (ZETIA) 10 MG tablet TAKE 1 TABLET BY MOUTH DAILY   lisinopril (ZESTRIL) 10 MG tablet Take 10 mg by mouth daily.   metFORMIN (GLUCOPHAGE) 1000 MG tablet Take 1,000 mg by mouth daily with breakfast.   omeprazole (PRILOSEC) 20 MG capsule Take 20 mg by mouth daily.   rosuvastatin (CRESTOR) 20 MG tablet TAKE 1 TABLET BY MOUTH AT BEDTIME   TURMERIC PO Take by mouth.     PAST MEDICAL HISTORY: Past Medical History:  Diagnosis Date   Barrett esophagus    Diabetes mellitus without complication (HCC)    Hyperlipidemia    Hypertension    Post-operative nausea and vomiting    post op headache    PAST SURGICAL HISTORY: Past Surgical History:  Procedure Laterality Date   UPPER GASTROINTESTINAL ENDOSCOPY     10 years ago -[place], [place]    FAMILY HISTORY: The patient family history includes Alzheimer's disease in her father and maternal grandmother; Cancer in her maternal grandfather, paternal aunt, paternal aunt, and paternal aunt; Colon cancer (age of onset: 66) in her sister; Colon polyps in her sister; Diabetes in her father; Emphysema in her paternal grandfather; Heart attack in her maternal grandmother; Hyperlipidemia in her mother; Hypertension in her mother; Pneumonia in her daughter; Stroke in her maternal grandmother; Thyroid disease in her mother.  SOCIAL HISTORY:  The patient  reports that she quit smoking about 8 years ago. Her smoking use included cigarettes. She has a 33.00 pack-year smoking history. She has never used smokeless tobacco.  She reports that she does not drink alcohol and does not use drugs.  REVIEW OF SYSTEMS: Review of Systems  Cardiovascular:  Negative for chest pain, dyspnea on exertion, leg swelling, near-syncope, orthopnea, palpitations, paroxysmal nocturnal dyspnea and syncope.  Respiratory:  Negative for  shortness of breath.     PHYSICAL EXAM:    02/09/2022    1:38 PM 11/09/2021    2:12 PM 09/12/2021    8:47 AM  Vitals with BMI  Height $Remov'5\' 6"'vSutJM$  $Remove'5\' 6"'CmuMRev$  $RemoveB'5\' 6"'ptuIPWYr$   Weight 217 lbs 6 oz 221 lbs 6 oz 222 lbs 3 oz  BMI 35.11 81.10 31.59  Systolic 458 592 924  Diastolic 66 79 85  Pulse 89 93 86    CONSTITUTIONAL: Well-developed and well-nourished. No acute distress.  SKIN: Skin is warm and dry. No rash noted. No cyanosis. No pallor. No jaundice HEAD: Normocephalic and atraumatic.  EYES: No scleral icterus MOUTH/THROAT: Moist oral membranes.  NECK: No JVD present. No thyromegaly noted. No carotid bruits  CHEST Normal respiratory effort. No intercostal retractions  LUNGS: Clear to auscultation bilaterally.  No stridor. No wheezes. No rales.  CARDIOVASCULAR: Regular rate and rhythm, positive S1-S2, no murmurs rubs or gallops appreciated. ABDOMINAL: Soft, nontender, nondistended, positive bowel sounds in all 4 quadrants, no apparent ascites.  EXTREMITIES: No peripheral edema, warm to touch, 2+ bilateral DP and PT pulses HEMATOLOGIC: No significant bruising NEUROLOGIC: Oriented to person, place, and time. Nonfocal. Normal muscle tone.  PSYCHIATRIC: Normal mood and affect. Normal behavior. Cooperative  CARDIAC DATABASE: EKG: 09/12/2021: NSR, 89 bpm, PRWP, without underlying injury pattern.   Echocardiogram: 09/26/2021:  Left ventricle cavity is normal in size and wall thickness. Normal global wall motion. Normal LV systolic function with EF 59%. Normal diastolic filling pattern. No significant valvular abnormality.  Normal right atrial pressure.   Stress Testing: Exercise nuclear stress test 11/01/2021: The patient exercised for 3 minutes and 1 seconds of a Bruce protocol, achieving approximately 4.64 METs and 88% of age predicted maximum heart rate. Exercise capacity was low. No chest pain reported. Accelerated heart rate. Normal hemodynamic response. Stress EKG revealed no ischemic changes. There  is a very small sized moderate reversible defect in the apical region. This may also represent mild breast attenuation artifact.  Overall LV systolic function is normal without regional wall motion abnormalities. Stress LV EF: 61%.  No previous exam available for comparison. Low risk.   Heart Catheterization: None  LABORATORY DATA: External Labs:  Date Collected: 08/31/2021 , information obtained by referring physician Potassium: 4.3 Creatinine 0.83 mg/dL. eGFR: 86 mL/min per 1.73 m Hemoglobin: 15.3 g/dL and hematocrit: 46.1 % AST: 19 , ALT: 24 , alkaline phosphatase: 129  Hemoglobin A1c: 10.8 TSH: 1.75    Collected 12/10/2018 available in Care Everywhere: Total cholesterol 163, triglycerides 134, HDL 45, LDL 95, non-HDL 118  Lipid Panel     Component Value Date/Time   CHOL 117 02/07/2022 0921   TRIG 81 02/07/2022 0921   HDL 46 02/07/2022 0921   LDLCALC 55 02/07/2022 0921   LDLDIRECT 49 02/07/2022 0921   LABVLDL 16 02/07/2022 0921   IMPRESSION:    ICD-10-CM   1. Coronary atherosclerosis due to calcified coronary lesion  I25.10    I25.84     2. Agatston CAC score, >400  R93.1     3. Benign hypertension  I10     4. Type 2 diabetes mellitus with hyperglycemia, without long-term current use of insulin (HCC)  E11.65     5. Type  2 diabetes mellitus with hyperlipidemia (HCC)  E11.69    E78.5     6. Class 2 severe obesity due to excess calories with serious comorbidity and body mass index (BMI) of 35.0 to 35.9 in adult [facility])  E66.01    Z68.35         RECOMMENDATIONS: Chalene Treu is a 51 y.o. Caucasian female whose past medical history and cardiac risk factors include: Non-insulin-dependent diabetes w/ neuropathy, hypertension, hyperlipidemia, former smoker.   Coronary atherosclerosis due to calcified coronary lesion / Agatston CAC score, >400 Total CAC 607, 99th percentile Continue aspirin and statin therapy.  Has undergone ischemic work-up as outlined  above. Remains asymptomatic with regards to angina pectoris or heart failure symptoms. Continue with improving her modifiable cardiovascular risk factors and secondary prevention.   Benign hypertension Office blood pressures are well controlled. We emphasized the importance of a low-salt diet and 150 minutes of moderate intensity exercise per week as tolerated  Type 2 diabetes mellitus with hyperglycemia, without long-term current use of insulin (HCC) Hemoglobin A1c improving. Currently managed by PCP. Currently on Farxiga, statins, ACE inhibitors.  Type 2 diabetes mellitus with hyperlipidemia (HCC) Currently on rosuvastatin and Zetia.   She denies myalgia or other side effects. Most recent lipids dated August 2023, independently reviewed as noted above. LDL currently at goal.   Class 2 severe obesity due to excess calories with serious comorbidity and body mass index (BMI) of 35.0 to 35.9 in adult [facility]) Body mass index is 35.09 kg/m. I reviewed with the patient the importance of diet, regular physical activity/exercise, weight loss.   Patient is educated on increasing physical activity gradually as tolerated.  With the goal of moderate intensity exercise for 30 minutes a day 5 days a week.  FINAL MEDICATION LIST END OF ENCOUNTER: No orders of the defined types were placed in this encounter.   There are no discontinued medications.    Current Outpatient Medications:    aspirin EC 81 MG tablet, Take 1 tablet (81 mg total) by mouth daily. Swallow whole., Disp: 30 tablet, Rfl: 12   dapagliflozin propanediol (FARXIGA) 10 MG TABS tablet, Farxiga 10 mg tablet, Disp: , Rfl:    DULoxetine (CYMBALTA) 60 MG capsule, Take 20 mg by mouth daily., Disp: , Rfl:    ezetimibe (ZETIA) 10 MG tablet, TAKE 1 TABLET BY MOUTH DAILY, Disp: 30 tablet, Rfl: 0   lisinopril (ZESTRIL) 10 MG tablet, Take 10 mg by mouth daily., Disp: , Rfl:    metFORMIN (GLUCOPHAGE) 1000 MG tablet, Take 1,000 mg by mouth daily  with breakfast., Disp: , Rfl:    omeprazole (PRILOSEC) 20 MG capsule, Take 20 mg by mouth daily., Disp: , Rfl:    rosuvastatin (CRESTOR) 20 MG tablet, TAKE 1 TABLET BY MOUTH AT BEDTIME, Disp: 90 tablet, Rfl: 0   TURMERIC PO, Take by mouth., Disp: , Rfl:    MOUNJARO 2.5 MG/0.5ML Pen, Inject into the skin., Disp: , Rfl:   No orders of the defined types were placed in this encounter.   There are no Patient Instructions on file for this visit.   --Continue cardiac medications as reconciled in final medication list. --Return in about 1 year (around 02/10/2023) for Annual follow up visit, Coronary artery calcification, Lipid. Or sooner if needed. --Continue follow-up with your primary care physician regarding the management of your other chronic comorbid conditions.  Patient's questions and concerns were addressed to her satisfaction. She voices understanding of the instructions provided during this encounter.  This note was created using a voice recognition software as a result there may be grammatical errors inadvertently enclosed that do not reflect the nature of this encounter. Every attempt is made to correct such errors.  Rex Kras, [place], [facility]  Pager: [phone] Office: [phone]

## 2022-02-24 ENCOUNTER — Other Ambulatory Visit: Payer: Self-pay | Admitting: Cardiology

## 2022-03-27 ENCOUNTER — Other Ambulatory Visit: Payer: Self-pay | Admitting: Cardiology

## 2022-04-28 ENCOUNTER — Other Ambulatory Visit: Payer: Self-pay | Admitting: Cardiology

## 2022-05-01 ENCOUNTER — Encounter (HOSPITAL_BASED_OUTPATIENT_CLINIC_OR_DEPARTMENT_OTHER): Payer: Self-pay

## 2022-05-01 ENCOUNTER — Emergency Department (HOSPITAL_BASED_OUTPATIENT_CLINIC_OR_DEPARTMENT_OTHER): Payer: 59 | Admitting: Radiology

## 2022-05-01 ENCOUNTER — Other Ambulatory Visit: Payer: Self-pay

## 2022-05-01 ENCOUNTER — Emergency Department (HOSPITAL_BASED_OUTPATIENT_CLINIC_OR_DEPARTMENT_OTHER)
Admission: EM | Admit: 2022-05-01 | Discharge: 2022-05-01 | Disposition: A | Discharge status: Home / Self Care | Payer: 59 | Attending: Emergency Medicine | Admitting: Emergency Medicine

## 2022-05-01 DIAGNOSIS — Z79899 Other long term (current) drug therapy: Secondary | ICD-10-CM | POA: Insufficient documentation

## 2022-05-01 DIAGNOSIS — Z87891 Personal history of nicotine dependence: Secondary | ICD-10-CM | POA: Insufficient documentation

## 2022-05-01 DIAGNOSIS — Z7982 Long term (current) use of aspirin: Secondary | ICD-10-CM | POA: Diagnosis not present

## 2022-05-01 DIAGNOSIS — R079 Chest pain, unspecified: Secondary | ICD-10-CM | POA: Diagnosis present

## 2022-05-01 DIAGNOSIS — R002 Palpitations: Secondary | ICD-10-CM | POA: Diagnosis not present

## 2022-05-01 DIAGNOSIS — E119 Type 2 diabetes mellitus without complications: Secondary | ICD-10-CM | POA: Diagnosis not present

## 2022-05-01 DIAGNOSIS — Z7984 Long term (current) use of oral hypoglycemic drugs: Secondary | ICD-10-CM | POA: Insufficient documentation

## 2022-05-01 DIAGNOSIS — R0789 Other chest pain: Secondary | ICD-10-CM | POA: Insufficient documentation

## 2022-05-01 DIAGNOSIS — I10 Essential (primary) hypertension: Secondary | ICD-10-CM | POA: Diagnosis not present

## 2022-05-01 LAB — TROPONIN I (HIGH SENSITIVITY)
Troponin I (High Sensitivity): 4 ng/L (ref <18)
Troponin I (High Sensitivity): 4 ng/L (ref <18)

## 2022-05-01 LAB — CBC
HCT: 42.8 % (ref 36.0–46.0)
Hemoglobin: 14 g/dL (ref 12.0–15.0)
MCH: 27.6 pg (ref 26.0–34.0)
MCHC: 32.7 g/dL (ref 30.0–36.0)
MCV: 84.3 fL (ref 80.0–100.0)
Platelets: 339 10*3/uL (ref 150–400)
RBC: 5.08 MIL/uL (ref 3.87–5.11)
RDW: 14 % (ref 11.5–15.5)
WBC: 13 10*3/uL — ABNORMAL HIGH (ref 4.0–10.5)
nRBC: 0 % (ref 0.0–0.2)

## 2022-05-01 LAB — BASIC METABOLIC PANEL
Anion gap: 11 (ref 5–15)
BUN: 16 mg/dL (ref 6–20)
CO2: 24 mmol/L (ref 22–32)
Calcium: 9.9 mg/dL (ref 8.9–10.3)
Chloride: 104 mmol/L (ref 98–111)
Creatinine, Ser: 0.97 mg/dL (ref 0.44–1.00)
GFR, Estimated: >60 mL/min (ref >60)
Glucose, Bld: 202 mg/dL — ABNORMAL HIGH (ref 70–99)
Potassium: 4.4 mmol/L (ref 3.5–5.1)
Sodium: 139 mmol/L (ref 135–145)

## 2022-05-01 LAB — PREGNANCY, URINE: Preg Test, Ur: NEGATIVE

## 2022-05-01 NOTE — ED Provider Notes (Signed)
[facility] EMERGENCY DEPT Provider Note  CSN: [id number] Arrival date & time: 05/01/22 2051  Chief Complaint(s) Chest Pain  HPI Leslie Hanna is a 51 y.o. female with history of diabetes, hyperlipidemia, hypertension presenting to the emergency department chest pain.  She reports that she had some chest pain yesterday, which occurred at rest, resolved on its own.  Today she had recurrent chest pain while mopping.  She describes it as a pressure.  She reports that she had some diaphoresis was resolved.  The pain did not relieved with rest.  She reports very mild ongoing chest pressure.  She reports sensation of palpitations.  Denies any shortness of breath, nausea or vomiting, lightheadedness, dizziness, weakness, cough, fevers or chills.  She denies any pleuritic pain, recent travel, recent surgeries.  Symptoms are mild.   Past Medical History Past Medical History:  Diagnosis Date   Barrett esophagus    Diabetes mellitus without complication ([place])    Hyperlipidemia    Hypertension    Post-operative nausea and vomiting    post op headache   Patient Active Problem List   Diagnosis Date Noted   Leukocytosis 12/31/2019   Home Medication(s) Prior to Admission medications   Medication Sig Start Date End Date Taking? Authorizing Provider  aspirin EC 81 MG tablet Take 1 tablet (81 mg total) by mouth daily. Swallow whole. 11/09/21   Tolia, Sunit, DO  dapagliflozin propanediol (FARXIGA) 10 MG TABS tablet Farxiga 10 mg tablet    [provider]  DULoxetine (CYMBALTA) 60 MG capsule Take 20 mg by mouth daily.    [provider]  ezetimibe (ZETIA) 10 MG tablet TAKE 1 TABLET BY MOUTH DAILY 04/30/22   Tolia, Sunit, DO  lisinopril (ZESTRIL) 10 MG tablet Take 10 mg by mouth daily.    [provider]  metFORMIN (GLUCOPHAGE) 1000 MG tablet Take 1,000 mg by mouth daily with breakfast. 09/17/19   [provider]  MOUNJARO 2.5 MG/0.5ML Pen Inject into  the skin. 01/10/22   [provider]  omeprazole (PRILOSEC) 20 MG capsule Take 20 mg by mouth daily.    [provider]  rosuvastatin (CRESTOR) 20 MG tablet TAKE 1 TABLET BY MOUTH AT BEDTIME 02/05/22   Tolia, Sunit, DO  TURMERIC PO Take by mouth.    [provider]                                                                                                                                    Past Surgical History Past Surgical History:  Procedure Laterality Date   UPPER GASTROINTESTINAL ENDOSCOPY     10 years ago -[place]   Family History Family History  Problem Relation Age of Onset   Hyperlipidemia Mother    Hypertension Mother    Thyroid disease Mother    Alzheimer's disease Father    Diabetes Father    Colon polyps Sister    Colon  cancer Sister 23   Cancer Paternal Aunt        breast   Cancer Paternal Aunt        breast   Cancer Paternal Aunt        breast    Alzheimer's disease Maternal Grandmother    Heart attack Maternal Grandmother    Stroke Maternal Grandmother    Cancer Maternal Grandfather        lung   Emphysema Paternal Grandfather    Pneumonia Daughter     Social History Social History   Tobacco Use   Smoking status: Former    Packs/day: 1.50    Years: 22.00    Total pack years: 33.00    Types: Cigarettes    Quit date: 2015    Years since quitting: 8.8   Smokeless tobacco: Never  Vaping Use   Vaping Use: Never used  Substance Use Topics   Alcohol use: No   Drug use: No   Allergies Patient has no known allergies.  Review of Systems Review of Systems  All other systems reviewed and are negative.   Physical Exam Vital Signs  I have reviewed the triage vital signs BP 113/67   Pulse 83   Temp 98 F (36.7 C) (Oral)   Resp (!) 23   Ht '5\' 6"'$  (1.676 Leslie)   Wt 96.6 kg   SpO2 94%   BMI 34.38 kg/Leslie  Physical Exam Vitals and nursing note reviewed.  Constitutional:      General: She is not in acute  distress.    Appearance: She is well-developed.  HENT:     Head: Normocephalic and atraumatic.     Mouth/Throat:     Mouth: Mucous membranes are moist.  Eyes:     Pupils: Pupils are equal, round, and reactive to light.  Cardiovascular:     Rate and Rhythm: Normal rate and regular rhythm.     Heart sounds: No murmur heard. Pulmonary:     Effort: Pulmonary effort is normal. No respiratory distress.     Breath sounds: Normal breath sounds.  Abdominal:     General: Abdomen is flat.     Palpations: Abdomen is soft.     Tenderness: There is no abdominal tenderness.  Musculoskeletal:        General: No tenderness.     Right lower leg: No edema.     Left lower leg: No edema.  Skin:    General: Skin is warm and dry.  Neurological:     General: No focal deficit present.     Mental Status: She is alert. Mental status is at baseline.  Psychiatric:        Mood and Affect: Mood normal.        Behavior: Behavior normal.     ED Results and Treatments Labs (all labs ordered are listed, but only abnormal results are displayed) Labs Reviewed  BASIC METABOLIC PANEL - Abnormal; Notable for the following components:      Result Value   Glucose, Bld 202 (*)    All other components within normal limits  CBC - Abnormal; Notable for the following components:   WBC 13.0 (*)    All other components within normal limits  PREGNANCY, URINE  TROPONIN I (HIGH SENSITIVITY)  TROPONIN I (HIGH SENSITIVITY)  Radiology DG Chest 2 View  Result Date: 05/01/2022 CLINICAL DATA:  CP EXAM: CHEST - 2 VIEW.  Patient is rotated on frontal view. COMPARISON:  CT cardiac chest 10/10/2021 FINDINGS: The heart and mediastinal contours are within normal limits. Slightly prominent right hilar vasculature likely due to patient rotation. No focal consolidation. No pulmonary edema. No pleural effusion.  No pneumothorax. No acute osseous abnormality. IMPRESSION: No active cardiopulmonary disease. Electronically Signed   By: Iven Finn Leslie.D.   On: 05/01/2022 21:32    Pertinent labs & imaging results that were available during my care of the patient were reviewed by me and considered in my medical decision making (see MDM for details).  Medications Ordered in ED Medications - No data to display                                                                                                                                   Procedures Procedures  (including critical care time)  Medical Decision Making / ED Course   MDM:  51 year old female presenting to the emergency department with chest pain.  Patient well-appearing, physical exam reassuring with no acute abnormality.  Unclear cause of chest pain, lower concern for ACS given atypical symptoms, not clearly associated with exertion, EKG appears reassuring with no acute ST or T wave changes concerning for ischemia.  Initial high-sensitivity troponin is negative and delta troponin also negative..  Discussed with cardiology who agrees that this does not sound like ACS and feel that patient is safe for discharge from a cardiology standpoint.  Doubt PE, no tachycardia, hypoxia, no leg swelling, no recent travel or surgeries, and no respiratory symptoms.  Chest x-ray without evidence of acute pneumonia or pneumothorax.  Doubt aortic pathology given mild symptoms, gradual onset.  Advised to follow-up with her outpatient cardiologist. Will discharge patient to home. All questions answered. Patient comfortable with plan of discharge. Return precautions discussed with patient and specified on the after visit summary.   Clinical Course as of 05/01/22 2355  Tue May 01, 2022  2310 Discussed with Dr. Cathie Hoops who agrees with dc if repeat high sensitivity troponin is negative.  [WS]    Clinical Course User Index [WS] Cristie Hem, MD      Additional history obtained: -Additional history obtained from family -External records from outside source obtained and reviewed including: Chart review including previous notes, labs, imaging, consultation notes including CT coronary artery, last cardiology note   Lab Tests: -I ordered, reviewed, and interpreted labs.   The pertinent results include:   Labs Reviewed  BASIC METABOLIC PANEL - Abnormal; Notable for the following components:      Result Value   Glucose, Bld 202 (*)    All other components within normal limits  CBC - Abnormal; Notable for the following components:   WBC 13.0 (*)    All other components within normal limits  PREGNANCY, URINE  TROPONIN I (  HIGH SENSITIVITY)  TROPONIN I (HIGH SENSITIVITY)    Notable for mild hyperglycemia and non-specific leukocytosis  EKG   EKG Interpretation  Date/Time:  Tuesday May 01 2022 20:59:43 EST Ventricular Rate:  83 PR Interval:  170 QRS Duration: 98 QT Interval:  366 QTC Calculation: 430 R Axis:   95 Text Interpretation: Normal sinus rhythm Rightward axis Low voltage QRS Borderline ECG No previous ECGs available Confirmed by Garnette Gunner [phone]) on 05/01/2022 9:37:50 PM         Imaging Studies ordered: I ordered imaging studies including CXR On my interpretation imaging demonstrates no acute process I independently visualized and interpreted imaging. I agree with the radiologist interpretation   Medicines ordered and prescription drug management: No orders of the defined types were placed in this encounter.   -I have reviewed the patients home medicines and have made adjustments as needed   Consultations Obtained: I requested consultation with the cardiologist,  and discussed lab and imaging findings as well as pertinent plan - they recommend: d/c if repeat trop negative   Cardiac Monitoring: The patient was maintained on a cardiac monitor.  I personally viewed and interpreted the  cardiac monitored which showed an underlying rhythm of: NSR  Social Determinants of Health:  Diagnosis or treatment significantly limited by social determinants of health: obesity   Reevaluation: After the interventions noted above, I reevaluated the patient and found that they have improved  Co morbidities that complicate the patient evaluation  Past Medical History:  Diagnosis Date   Barrett esophagus    Diabetes mellitus without complication ([place])    Hyperlipidemia    Hypertension    Post-operative nausea and vomiting    post op headache      Dispostion: Disposition decision including need for hospitalization was considered, and patient discharged from emergency department.    Final Clinical Impression(s) / ED Diagnoses Final diagnoses:  Atypical chest pain     This chart was dictated using voice recognition software.  Despite best efforts to proofread,  errors can occur which can change the documentation meaning.    Cristie Hem, MD 05/01/22 [phone]

## 2022-05-01 NOTE — Discharge Instructions (Addendum)
Your cardiac testing was negative. Please follow up with your cardiologist.   Please return if you develop any worsening symptoms such as recurrent or severe pain, vomiting, sweating, fainting, lightheadedness, shortness of breath, or any other concerning symptoms.

## 2022-05-01 NOTE — ED Triage Notes (Signed)
Pt c/o mid/ L sided CP/ pressure onset yesterday w "mild palpitations, discomfort." Denies radiation, N/V, SHOB. Stated she woke up "fine today," but noted CP after mopping, no relief on rest. Hx CAD

## 2022-05-02 ENCOUNTER — Ambulatory Visit: Payer: 59

## 2022-05-02 VITALS — BP 120/79 | HR 95 | Resp 14 | Ht 66.0 in | Wt 216.4 lb

## 2022-05-02 DIAGNOSIS — R931 Abnormal findings on diagnostic imaging of heart and coronary circulation: Secondary | ICD-10-CM

## 2022-05-02 DIAGNOSIS — I200 Unstable angina: Secondary | ICD-10-CM

## 2022-05-02 DIAGNOSIS — I2510 Atherosclerotic heart disease of native coronary artery without angina pectoris: Secondary | ICD-10-CM

## 2022-05-02 MED ORDER — METOPROLOL SUCCINATE ER 25 MG PO TB24: 25.0000 mg | ORAL_TABLET | Freq: Every day | ORAL | 2 refills | Status: DC

## 2022-05-02 MED ORDER — NITROGLYCERIN 0.4 MG SL SUBL: 0.4000 mg | SUBLINGUAL_TABLET | SUBLINGUAL | 3 refills | Status: AC | PRN

## 2022-05-02 NOTE — Progress Notes (Signed)
 ID:  Leslie Hanna, DOB 08/06/1970, MRN [id number]  PCP:  Long, Scott, PA-C  Cardiologist:  Sunit Tolia, DO, FACC  (established care 09/12/2021)  Date: 05/02/22 Last Office Visit: 11/09/2021  Chief Complaint  Patient presents with   Chest Pain   Follow-up    ER f/u   HPI  Leslie Hanna is a 51 y.o. Caucasian female whose past medical history and cardiovascular risk factors include: Severe coronary artery calcification (total CAC 607, 99th percentile), non-insulin-dependent diabetes w/ neuropathy, hypertension, hyperlipidemia, former smoker.   Referred to the practice for evaluation of palpitations by the time the appointment was made she was asymptomatic.  However she did have concerns for precordial discomfort and given her cardiovascular risk factors including poorly controlled diabetes that shared decision was to proceed with coronary calcium score.  She was noted to have severe CAC with a total score of 607, placing her at the 99th percentile.  She underwent nuclear stress test which noted a small size moderate intensity reversible perfusion defect at the apex suggestive of possible ischemia versus breast attenuation artifact.  Clinically she remains asymptomatic.  Given her severe coronary artery calcification and diabetes at the last office visit patient's statin therapy was uptitrated.  She had repeat labs which note significant improvement in LDL and LFTs remained stable.  She presents today for hospital follow-up.  She was seen in the emergency department yesterday 05/01/2022 for chest pain which initially occurred at rest and resolved on its own.  She then had recurrent chest pain while mopping which she described as a pressure with accompanying diaphoresis.  This pain was not relieved with rest.  She did have reassuring work-up in the emergency department.  EKG did not reveal any acute ST or T wave changes, normal chest xray, and troponins were negative.  She was advised to  follow-up outpatient. She is not currently having active chest pain. She does endorse palpitations. Denies shortness of breath, nausea, vomiting.   FUNCTIONAL STATUS: No structured exercise program or daily routine.   ALLERGIES: No Known Allergies  MEDICATION LIST PRIOR TO VISIT: Current Meds  Medication Sig   aspirin EC 81 MG tablet Take 1 tablet (81 mg total) by mouth daily. Swallow whole.   dapagliflozin propanediol (FARXIGA) 10 MG TABS tablet Farxiga 10 mg tablet   DULoxetine (CYMBALTA) 60 MG capsule Take 20 mg by mouth daily.   ezetimibe (ZETIA) 10 MG tablet TAKE 1 TABLET BY MOUTH DAILY   lisinopril (ZESTRIL) 10 MG tablet Take 10 mg by mouth daily.   metFORMIN (GLUCOPHAGE) 1000 MG tablet Take 1,000 mg by mouth daily with breakfast.   metoprolol succinate (TOPROL-XL) 25 MG 24 hr tablet Take 1 tablet (25 mg total) by mouth daily. Take with or immediately following a meal.   MOUNJARO 2.5 MG/0.5ML Pen Inject into the skin.   nitroGLYCERIN (NITROSTAT) 0.4 MG SL tablet Place 1 tablet (0.4 mg total) under the tongue every 5 (five) minutes as needed for up to 25 days for chest pain.   omeprazole (PRILOSEC) 20 MG capsule Take 20 mg by mouth daily.   rosuvastatin (CRESTOR) 20 MG tablet TAKE 1 TABLET BY MOUTH AT BEDTIME     PAST MEDICAL HISTORY: Past Medical History:  Diagnosis Date   Barrett esophagus    Diabetes mellitus without complication (HCC)    Hyperlipidemia    Hypertension    Post-operative nausea and vomiting    post op headache    PAST SURGICAL HISTORY: Past Surgical History:  Procedure   Laterality Date   UPPER GASTROINTESTINAL ENDOSCOPY     10 years ago -[place], [place]    FAMILY HISTORY: The patient family history includes Alzheimer's disease in her father and maternal grandmother; Asthma in her sister; Cancer in her maternal grandfather, paternal aunt, paternal aunt, and paternal aunt; Colon cancer (age of onset: 59) in her sister; Colon polyps in her sister;  Diabetes in her father; Emphysema in her paternal grandfather; Heart attack in her maternal grandmother; Hyperlipidemia in her mother; Hypertension in her mother; Pneumonia in her daughter; Stroke in her maternal grandmother; Thyroid disease in her mother.  SOCIAL HISTORY:  The patient  reports that she quit smoking about 8 years ago. Her smoking use included cigarettes. She has a 33.00 pack-year smoking history. She has never used smokeless tobacco. She reports that she does not drink alcohol and does not use drugs.  REVIEW OF SYSTEMS: Review of Systems  Cardiovascular:  Positive for palpitations. Negative for chest pain, dyspnea on exertion, leg swelling, near-syncope, orthopnea, paroxysmal nocturnal dyspnea and syncope.  Respiratory:  Negative for shortness of breath.   Gastrointestinal:  Negative for nausea and vomiting.    PHYSICAL EXAM:    05/02/2022    1:50 PM 05/01/2022   11:45 PM 05/01/2022   10:45 PM  Vitals with BMI  Height 5' 6"    Weight 216 lbs 6 oz    BMI 90.21    Systolic 115  520  Diastolic 79  67  Pulse 95 82 83   Physical Exam Cardiovascular:     Rate and Rhythm: Normal rate and regular rhythm.     Pulses: Normal pulses.          Carotid pulses are 2+ on the right side and 2+ on the left side.      Radial pulses are 2+ on the right side and 2+ on the left side.       Dorsalis pedis pulses are 2+ on the right side and 2+ on the left side.     Heart sounds: Normal heart sounds. No murmur heard.    No gallop.  Pulmonary:     Effort: Pulmonary effort is normal.     Breath sounds: Normal breath sounds. No wheezing or rales.  Musculoskeletal:     Right lower leg: No edema.     Left lower leg: No edema.     RADIOLOGY:  Chest Xray 05/01/2022: FINDINGS: The heart and mediastinal contours are within normal limits. Slightly prominent right hilar vasculature likely due to patient rotation.   No focal consolidation. No pulmonary edema. No pleural effusion.  No pneumothorax.   No acute osseous abnormality.   IMPRESSION: No active cardiopulmonary disease.  CARDIAC DATABASE: EKG 05/02/2022: Normal sinus rhythm at a rate of 80 bpm.  Normal axis.  Poor R wave progression and low voltage, may be secondary to pulmonary disease but cannot exclude old anterior infarct.  Compared to previous EKG on 09/12/2021, no significant change.   Echocardiogram: 09/26/2021:  Left ventricle cavity is normal in size and wall thickness. Normal global wall motion. Normal LV systolic function with EF 59%. Normal diastolic filling pattern. No significant valvular abnormality.  Normal right atrial pressure.   Stress Testing: Exercise nuclear stress test 11/01/2021: The patient exercised for 3 minutes and 1 seconds of a Bruce protocol, achieving approximately 4.64 METs and 88% of age predicted maximum heart rate. Exercise capacity was low. No chest pain reported. Accelerated heart rate. Normal hemodynamic response. Stress EKG revealed no  ischemic changes. There is a very small sized moderate reversible defect in the apical region. This may also represent mild breast attenuation artifact.  Overall LV systolic function is normal without regional wall motion abnormalities. Stress LV EF: 61%.  No previous exam available for comparison. Low risk.  Heart Catheterization: None  LABORATORY DATA: External Labs:  Date Collected: 08/31/2021 , information obtained by referring physician Potassium: 4.3 Creatinine 0.83 mg/dL. eGFR: 86 mL/min per 1.73 m Hemoglobin: 15.3 g/dL and hematocrit: 46.1 % AST: 19 , ALT: 24 , alkaline phosphatase: 129  Hemoglobin A1c: 10.8 TSH: 1.75    Collected 12/10/2018 available in Care Everywhere: Total cholesterol 163, triglycerides 134, HDL 45, LDL 95, non-HDL 118  Lipid Panel     Component Value Date/Time   CHOL 117 02/07/2022 0921   TRIG 81 02/07/2022 0921   HDL 46 02/07/2022 0921   LDLCALC 55 02/07/2022 0921   LDLDIRECT 49 02/07/2022  0921   LABVLDL 16 02/07/2022 0921   IMPRESSION:    ICD-10-CM   1. Unstable angina (HCC)  I20.0 EKG 12-Lead    2. Agatston CAC score, >400  R93.1     3. Coronary atherosclerosis due to calcified coronary lesion  I25.10    I25.84         RECOMMENDATIONS: Leslie Hanna is a 52 y.o. Caucasian female whose past medical history and cardiac risk factors include: Non-insulin-dependent diabetes w/ neuropathy, hypertension, hyperlipidemia, former smoker.   Unstable angina (HCC) Agatston CAC score, >400 Coronary atherosclerosis due to calcified coronary lesion Total CAC 607, 99th percentile. Has undergone ischemic work-up as outlined above. Work-up in the ED on 05/01/2022 revealed negative troponins, no ST or T wave changes on EKG, and normal chest x-ray. EKG today did not show acute ischemia or underlying injury pattern. Not currently having active chest pain. She is not currently on antianginal therapy. At this time we will medically manage chest pain.  We will start metoprolol 25 mg daily and sublingual nitroglycerin as needed.  Discussed with patient how and when to take nitroglycerin and when to seek emergency care for chest pain unrelieved by sublingual nitroglycerin. If she has recurrence of chest pain will plan to schedule for cardiac catheterization.  Discussed with Dr. Virgina Jock. Previous lab results showed that lipids are under excellent control. Blood pressure is well controlled.  We will continue aspirin 81 mg daily and statin therapy.  Patient instructed not to do heavy lifting, heavy exertional activity, swimming until evaluation is complete.  Patient instructed to call if symptoms worse or to go to the ED for further evaluation.  FINAL MEDICATION LIST END OF ENCOUNTER: Meds ordered this encounter  Medications   metoprolol succinate (TOPROL-XL) 25 MG 24 hr tablet    Sig: Take 1 tablet (25 mg total) by mouth daily. Take with or immediately following a meal.    Dispense:   30 tablet    Refill:  2    Order Specific Question:   Supervising Provider    Answer:   Adrian Prows [[id number]]   nitroGLYCERIN (NITROSTAT) 0.4 MG SL tablet    Sig: Place 1 tablet (0.4 mg total) under the tongue every 5 (five) minutes as needed for up to 25 days for chest pain.    Dispense:  25 tablet    Refill:  3    Order Specific Question:   Supervising Provider    Answer:   Adrian Prows [[id number]]    Medications Discontinued During This Encounter  Medication Reason   TURMERIC PO  Current Outpatient Medications:    aspirin EC 81 MG tablet, Take 1 tablet (81 mg total) by mouth daily. Swallow whole., Disp: 30 tablet, Rfl: 12   dapagliflozin propanediol (FARXIGA) 10 MG TABS tablet, Farxiga 10 mg tablet, Disp: , Rfl:    DULoxetine (CYMBALTA) 60 MG capsule, Take 20 mg by mouth daily., Disp: , Rfl:    ezetimibe (ZETIA) 10 MG tablet, TAKE 1 TABLET BY MOUTH DAILY, Disp: 90 tablet, Rfl: 3   lisinopril (ZESTRIL) 10 MG tablet, Take 10 mg by mouth daily., Disp: , Rfl:    metFORMIN (GLUCOPHAGE) 1000 MG tablet, Take 1,000 mg by mouth daily with breakfast., Disp: , Rfl:    metoprolol succinate (TOPROL-XL) 25 MG 24 hr tablet, Take 1 tablet (25 mg total) by mouth daily. Take with or immediately following a meal., Disp: 30 tablet, Rfl: 2   MOUNJARO 2.5 MG/0.5ML Pen, Inject into the skin., Disp: , Rfl:    nitroGLYCERIN (NITROSTAT) 0.4 MG SL tablet, Place 1 tablet (0.4 mg total) under the tongue every 5 (five) minutes as needed for up to 25 days for chest pain., Disp: 25 tablet, Rfl: 3   omeprazole (PRILOSEC) 20 MG capsule, Take 20 mg by mouth daily., Disp: , Rfl:    rosuvastatin (CRESTOR) 20 MG tablet, TAKE 1 TABLET BY MOUTH AT BEDTIME, Disp: 90 tablet, Rfl: 0  Orders Placed This Encounter  Procedures   EKG 12-Lead     There are no Patient Instructions on file for this visit.   --Continue cardiac medications as reconciled in final medication list. --Return in about 2 weeks (around 05/16/2022) for CP.  Or sooner if needed. --Continue follow-up with your primary care physician regarding the management of your other chronic comorbid conditions.  Patient's questions and concerns were addressed to her satisfaction. She voices understanding of the instructions provided during this encounter.   This note was created using a voice recognition software as a result there may be grammatical errors inadvertently enclosed that do not reflect the nature of this encounter. Every attempt is made to correct such errors.    Ernst Spell, [place] Pager: [phone] Office: [phone]

## 2022-05-06 ENCOUNTER — Other Ambulatory Visit: Payer: Self-pay | Admitting: Cardiology

## 2022-05-06 DIAGNOSIS — I2510 Atherosclerotic heart disease of native coronary artery without angina pectoris: Secondary | ICD-10-CM

## 2022-05-06 DIAGNOSIS — R931 Abnormal findings on diagnostic imaging of heart and coronary circulation: Secondary | ICD-10-CM

## 2022-05-06 DIAGNOSIS — E1169 Type 2 diabetes mellitus with other specified complication: Secondary | ICD-10-CM

## 2022-05-16 ENCOUNTER — Ambulatory Visit: Payer: 59

## 2022-05-16 VITALS — BP 109/71 | HR 82 | Ht 65.0 in | Wt 217.2 lb

## 2022-05-16 DIAGNOSIS — I2510 Atherosclerotic heart disease of native coronary artery without angina pectoris: Secondary | ICD-10-CM

## 2022-05-16 DIAGNOSIS — R002 Palpitations: Secondary | ICD-10-CM

## 2022-05-16 DIAGNOSIS — R931 Abnormal findings on diagnostic imaging of heart and coronary circulation: Secondary | ICD-10-CM

## 2022-05-16 DIAGNOSIS — I10 Essential (primary) hypertension: Secondary | ICD-10-CM

## 2022-05-16 DIAGNOSIS — I200 Unstable angina: Secondary | ICD-10-CM

## 2022-05-16 NOTE — Progress Notes (Signed)
ID:  Leslie Hanna, DOB Jul 22, 1970, MRN [id number]  PCP:  Shanon Rosser, PA-C  Cardiologist:  Rex Kras, DO, [facility]  (established care 09/12/2021)  Date: 05/16/22 Last Office Visit: 11/09/2021  Chief Complaint  Patient presents with   Hyperlipidemia   Hypertension    2 week for chest pain   HPI  Leslie Hanna is a 51 y.o. Caucasian female whose past medical history and cardiovascular risk factors include: Severe coronary artery calcification (total CAC 607, 99th percentile), non-insulin-dependent diabetes w/ neuropathy, hypertension, hyperlipidemia, former smoker.   Initially referred to the practice for evaluation of palpitations by the time the appointment was made she was asymptomatic.  However she did have concerns for precordial discomfort and given her cardiovascular risk factors including poorly controlled diabetes that shared decision was to proceed with coronary calcium score.  She was noted to have severe CAC with a total score of 607, placing her at the 99th percentile.  She underwent nuclear stress test which noted a small size moderate intensity reversible perfusion defect at the apex suggestive of possible ischemia versus breast attenuation artifact.  Clinically she remains asymptomatic.  She presents today for 2-week follow-up.  At last office visit she was seen by hospital visit for chest pain.  Emergency department work-up was reassuring given negative troponins and EKG changes, normal.  She was started on metoprolol 25 and prescribed sublingual nitroglycerin as needed for chest pain.  She has not had any recurrence of chest pain or shortness of breath since last visit. She does occasionally have palpitations once per week that now only last a few seconds and resolve on their own. No correlation with palpitations and eating. She has done well on Metoprolol and has not taken any sublingual nitroglycerin.  FUNCTIONAL STATUS: No structured exercise program or daily routine.    ALLERGIES: No Known Allergies  MEDICATION LIST PRIOR TO VISIT: Current Meds  Medication Sig   aspirin EC 81 MG tablet Take 1 tablet (81 mg total) by mouth daily. Swallow whole.   dapagliflozin propanediol (FARXIGA) 10 MG TABS tablet Farxiga 10 mg tablet   DULoxetine (CYMBALTA) 60 MG capsule Take 20 mg by mouth daily.   ezetimibe (ZETIA) 10 MG tablet TAKE 1 TABLET BY MOUTH DAILY   lisinopril (ZESTRIL) 10 MG tablet Take 10 mg by mouth daily.   medroxyPROGESTERone (DEPO-PROVERA) 150 MG/ML injection Inject 150 mg into the muscle every 3 (three) months.   metFORMIN (GLUCOPHAGE) 1000 MG tablet Take 1,000 mg by mouth daily with breakfast.   metoprolol succinate (TOPROL-XL) 25 MG 24 hr tablet Take 1 tablet (25 mg total) by mouth daily. Take with or immediately following a meal.   MOUNJARO 2.5 MG/0.5ML Pen Inject into the skin.   nitroGLYCERIN (NITROSTAT) 0.4 MG SL tablet Place 1 tablet (0.4 mg total) under the tongue every 5 (five) minutes as needed for up to 25 days for chest pain.   omeprazole (PRILOSEC) 20 MG capsule Take 20 mg by mouth daily.   rosuvastatin (CRESTOR) 20 MG tablet TAKE 1 TABLET BY MOUTH AT BEDTIME     PAST MEDICAL HISTORY: Past Medical History:  Diagnosis Date   Barrett esophagus    Diabetes mellitus without complication (HCC)    Hyperlipidemia    Hypertension    Post-operative nausea and vomiting    post op headache    PAST SURGICAL HISTORY: Past Surgical History:  Procedure Laterality Date   UPPER GASTROINTESTINAL ENDOSCOPY     10 years ago -[place], [place]  FAMILY HISTORY: The patient family history includes Alzheimer's disease in her father and maternal grandmother; Asthma in her sister; Cancer in her maternal grandfather, paternal aunt, paternal aunt, and paternal aunt; Colon cancer (age of onset: 46) in her sister; Colon polyps in her sister; Diabetes in her father; Emphysema in her paternal grandfather; Heart attack in her maternal grandmother;  Hyperlipidemia in her mother; Hypertension in her mother; Pneumonia in her daughter; Stroke in her maternal grandmother; Thyroid disease in her mother.  SOCIAL HISTORY:  The patient  reports that she quit smoking about 8 years ago. Her smoking use included cigarettes. She has a 33.00 pack-year smoking history. She has never used smokeless tobacco. She reports that she does not drink alcohol and does not use drugs.  REVIEW OF SYSTEMS: Review of Systems  Cardiovascular:  Positive for palpitations (occaisonal, improved). Negative for chest pain, dyspnea on exertion, leg swelling, near-syncope, orthopnea, paroxysmal nocturnal dyspnea and syncope.  Respiratory:  Negative for shortness of breath.   Gastrointestinal:  Negative for nausea and vomiting.    PHYSICAL EXAM:    05/16/2022    1:05 PM 05/02/2022    1:50 PM 05/01/2022   11:45 PM  Vitals with BMI  Height _0  _1    Weight 217 lbs 3 oz 216 lbs 6 oz   BMI 34.19 37.90   Systolic 240 973   Diastolic 71 79   Pulse 82 95 82   Physical Exam Cardiovascular:     Rate and Rhythm: Normal rate and regular rhythm.     Pulses: Normal pulses.     Heart sounds: Normal heart sounds. No murmur heard.    No gallop.  Pulmonary:     Effort: Pulmonary effort is normal.     Breath sounds: Normal breath sounds. No wheezing or rales.  Musculoskeletal:     Right lower leg: No edema.     Left lower leg: No edema.    RADIOLOGY:  Chest Xray 05/01/2022: FINDINGS: The heart and mediastinal contours are within normal limits. Slightly prominent right hilar vasculature likely due to patient rotation.   No focal consolidation. No pulmonary edema. No pleural effusion. No pneumothorax.   No acute osseous abnormality.   IMPRESSION: No active cardiopulmonary disease.  CARDIAC DATABASE: EKG 05/02/2022: Normal sinus rhythm at a rate of 80 bpm.  Normal axis.  Poor R wave progression and low voltage, may be secondary to pulmonary disease but cannot  exclude old anterior infarct.  Compared to previous EKG on 09/12/2021, no significant change.   Echocardiogram: 09/26/2021:  Left ventricle cavity is normal in size and wall thickness. Normal global wall motion. Normal LV systolic function with EF 59%. Normal diastolic filling pattern. No significant valvular abnormality.  Normal right atrial pressure.   Stress Testing: Exercise nuclear stress test 11/01/2021: The patient exercised for 3 minutes and 1 seconds of a Bruce protocol, achieving approximately 4.64 METs and 88% of age predicted maximum heart rate. Exercise capacity was low. No chest pain reported. Accelerated heart rate. Normal hemodynamic response. Stress EKG revealed no ischemic changes. There is a very small sized moderate reversible defect in the apical region. This may also represent mild breast attenuation artifact.  Overall LV systolic function is normal without regional wall motion abnormalities. Stress LV EF: 61%.  No previous exam available for comparison. Low risk.  Heart Catheterization: None  LABORATORY DATA:     Latest Ref Rng & Units 05/01/2022    9:01 PM 02/07/2022    9:22 AM 12/20/2021  8:25 AM  BMP  Glucose 70 - 99 mg/dL 202  161  157   BUN 6 - 20 mg/dL _0 Creatinine 0.44 - 1.00 mg/dL 0.97  0.84  0.83   BUN/Creat Ratio 9 - _1 Sodium 135 - 145 mmol/L 139  137  136   Potassium 3.5 - 5.1 mmol/L 4.4  4.7  4.2   Chloride 98 - 111 mmol/L 104  98  102   CO2 22 - 32 mmol/L _2 Calcium 8.9 - 10.3 mg/dL 9.9  9.5  9.7    CBC    Component Value Date/Time   WBC 13.0 (H) 05/01/2022 2101   RBC 5.08 05/01/2022 2101   HGB 14.0 05/01/2022 2101   HGB 14.4 12/31/2019 1234   HCT 42.8 05/01/2022 2101   PLT 339 05/01/2022 2101   PLT 296 12/31/2019 1234   MCV 84.3 05/01/2022 2101   MCH 27.6 05/01/2022 2101   MCHC 32.7 05/01/2022 2101   RDW 14.0 05/01/2022 2101   LYMPHSABS 2.7 12/31/2019 1234   MONOABS 0.6 12/31/2019 1234   EOSABS 0.3  12/31/2019 1234   BASOSABS 0.1 12/31/2019 1234   Lipid Panel     Component Value Date/Time   CHOL 117 02/07/2022 0921   TRIG 81 02/07/2022 0921   HDL 46 02/07/2022 0921   LDLCALC 55 02/07/2022 0921   LDLDIRECT 49 02/07/2022 0921   LABVLDL 16 02/07/2022 0921   External Labs:  Date Collected: 08/31/2021 , information obtained by referring physician Potassium: 4.3 Creatinine 0.83 mg/dL. eGFR: 86 mL/min per 1.73 m Hemoglobin: 15.3 g/dL and hematocrit: 46.1 % AST: 19 , ALT: 24 , alkaline phosphatase: 129  Hemoglobin A1c: 10.8 TSH: 1.75    IMPRESSION:    ICD-10-CM   1. Unstable angina (HCC)  I20.0     2. Agatston CAC score, >400  R93.1 Lipid Panel With LDL/HDL Ratio    3. Coronary atherosclerosis due to calcified coronary lesion  I25.10 Lipid Panel With LDL/HDL Ratio   I25.84     4. Palpitations  R00.2     5. Benign hypertension  I10 Lipid Panel With LDL/HDL Ratio      RECOMMENDATIONS: Leslie Hanna is a 51 y.o. Caucasian female whose past medical history and cardiac risk factors include: Non-insulin-dependent diabetes w/ neuropathy, hypertension, hyperlipidemia, former smoker.   Unstable angina (HCC) Agatston CAC score, >400 Coronary atherosclerosis due to calcified coronary lesion Has not had any recurrence of chest pain since prior visit and has not had to use any sublingual nitroglycerin. We will continue current medications without changes. Advised patient that if she has recurrence of chest pain that occurs during exertion and is relieved at rest or with sublingual nitroglycerin to notify office or seek emergency care if needed. Given cardiac risk factors at home coronary calcium score low threshold for cardiac catheterization if she does have recurrence of chest pain. Continues on rosuvastatin 37m and Zetia 170mwithout myalgias. Will continue Aspirin 8160mshe is tolerating without bleeding diathesis. Will recheck lipids prior to next  visit.  Palpitations Frequency and duration of palpitations have significantly improved last visit and starting metoprolol.  Benign hypertension Blood pressure is under excellent control.  She brings with her today a list of home blood pressure readings. Discussed the importance of diet, exercise, and weight loss.  FINAL MEDICATION LIST END OF ENCOUNTER: No orders of the defined types were placed in this encounter.  There are no discontinued medications.     Current Outpatient Medications:    aspirin EC 81 MG tablet, Take 1 tablet (81 mg total) by mouth daily. Swallow whole., Disp: 30 tablet, Rfl: 12   dapagliflozin propanediol (FARXIGA) 10 MG TABS tablet, Farxiga 10 mg tablet, Disp: , Rfl:    DULoxetine (CYMBALTA) 60 MG capsule, Take 20 mg by mouth daily., Disp: , Rfl:    ezetimibe (ZETIA) 10 MG tablet, TAKE 1 TABLET BY MOUTH DAILY, Disp: 90 tablet, Rfl: 3   lisinopril (ZESTRIL) 10 MG tablet, Take 10 mg by mouth daily., Disp: , Rfl:    medroxyPROGESTERone (DEPO-PROVERA) 150 MG/ML injection, Inject 150 mg into the muscle every 3 (three) months., Disp: , Rfl:    metFORMIN (GLUCOPHAGE) 1000 MG tablet, Take 1,000 mg by mouth daily with breakfast., Disp: , Rfl:    metoprolol succinate (TOPROL-XL) 25 MG 24 hr tablet, Take 1 tablet (25 mg total) by mouth daily. Take with or immediately following a meal., Disp: 30 tablet, Rfl: 2   MOUNJARO 2.5 MG/0.5ML Pen, Inject into the skin., Disp: , Rfl:    nitroGLYCERIN (NITROSTAT) 0.4 MG SL tablet, Place 1 tablet (0.4 mg total) under the tongue every 5 (five) minutes as needed for up to 25 days for chest pain., Disp: 25 tablet, Rfl: 3   omeprazole (PRILOSEC) 20 MG capsule, Take 20 mg by mouth daily., Disp: , Rfl:    rosuvastatin (CRESTOR) 20 MG tablet, TAKE 1 TABLET BY MOUTH AT BEDTIME, Disp: 90 tablet, Rfl: 0  Orders Placed This Encounter  Procedures   Lipid Panel With LDL/HDL Ratio    There are no Patient Instructions on file for this visit.    --Continue cardiac medications as reconciled in final medication list. --Return in about 6 months (around 11/14/2022) for Palps, CP. Or sooner if needed. --Continue follow-up with your primary care physician regarding the management of your other chronic comorbid conditions.  Patient's questions and concerns were addressed to her satisfaction. She voices understanding of the instructions provided during this encounter.   This note was created using a voice recognition software as a result there may be grammatical errors inadvertently enclosed that do not reflect the nature of this encounter. Every attempt is made to correct such errors.    Ernst Spell, [place] Pager: [phone] Office: [phone]

## 2022-07-27 ENCOUNTER — Other Ambulatory Visit: Payer: Self-pay

## 2022-08-10 ENCOUNTER — Other Ambulatory Visit: Payer: Self-pay

## 2022-08-10 DIAGNOSIS — E785 Hyperlipidemia, unspecified: Secondary | ICD-10-CM

## 2022-08-10 DIAGNOSIS — I2510 Atherosclerotic heart disease of native coronary artery without angina pectoris: Secondary | ICD-10-CM

## 2022-08-10 DIAGNOSIS — R931 Abnormal findings on diagnostic imaging of heart and coronary circulation: Secondary | ICD-10-CM

## 2022-08-10 MED ORDER — ROSUVASTATIN CALCIUM 20 MG PO TABS: 20.0000 mg | ORAL_TABLET | Freq: Every day | ORAL | 3 refills | Status: DC

## 2022-08-17 ENCOUNTER — Telehealth: Payer: Self-pay

## 2022-08-17 NOTE — Telephone Encounter (Signed)
Received a new patient referral from Shanon Rosser PA at [facility] for this patient. Sent a message to [place] [facility]. In scheduling to contact patient to make an appointment.

## 2022-08-23 ENCOUNTER — Telehealth: Payer: Self-pay | Admitting: Hematology

## 2022-08-23 NOTE — Telephone Encounter (Signed)
Called pt to sch appt with Dr. Burr Medico per 3/5 referral. No answer and no vm is set up.

## 2022-10-18 NOTE — Telephone Encounter (Signed)
From patient.

## 2022-11-01 LAB — LIPID PANEL WITH LDL/HDL RATIO
Cholesterol, Total: 96 mg/dL — ABNORMAL LOW (ref 100–199)
HDL: 42 mg/dL (ref >39)
LDL Chol Calc (NIH): 35 mg/dL (ref 0–99)
LDL/HDL Ratio: 0.8 ratio (ref 0.0–3.2)
Triglycerides: 98 mg/dL (ref 0–149)
VLDL Cholesterol Cal: 19 mg/dL (ref 5–40)

## 2022-11-02 NOTE — Progress Notes (Signed)
Called patient to inform her about her schedule

## 2022-11-06 ENCOUNTER — Ambulatory Visit: Payer: 59 | Admitting: Cardiology

## 2022-11-06 ENCOUNTER — Encounter: Payer: Self-pay | Admitting: Cardiology

## 2022-11-06 VITALS — BP 121/77 | HR 82 | Ht 65.0 in | Wt 216.0 lb

## 2022-11-06 DIAGNOSIS — R931 Abnormal findings on diagnostic imaging of heart and coronary circulation: Secondary | ICD-10-CM

## 2022-11-06 DIAGNOSIS — R072 Precordial pain: Secondary | ICD-10-CM

## 2022-11-06 DIAGNOSIS — E1169 Type 2 diabetes mellitus with other specified complication: Secondary | ICD-10-CM

## 2022-11-06 DIAGNOSIS — I2510 Atherosclerotic heart disease of native coronary artery without angina pectoris: Secondary | ICD-10-CM

## 2022-11-06 DIAGNOSIS — E1165 Type 2 diabetes mellitus with hyperglycemia: Secondary | ICD-10-CM

## 2022-11-06 DIAGNOSIS — I10 Essential (primary) hypertension: Secondary | ICD-10-CM

## 2022-11-06 MED ORDER — METOPROLOL SUCCINATE ER 25 MG PO TB24: ORAL_TABLET | ORAL | 1 refills | Status: DC

## 2022-11-06 NOTE — Progress Notes (Signed)
ID:  Leslie Hanna, DOB [birth year], MRN [id number]  PCP:  Debroah Baller, FNP  Cardiologist:  Tessa Lerner, DO, [facility]  (established care 09/12/2021)  Date: 11/06/22 Last Office Visit: 05/16/2022  Chief Complaint  Patient presents with   Follow-up   Chest Pain   HPI  Leslie Hanna is a 52 y.o. Caucasian female whose past medical history and cardiovascular risk factors include: Severe coronary artery calcification (total CAC 607, 99th percentile), non-insulin-dependent diabetes w/ neuropathy, hypertension, hyperlipidemia, former smoker, history of Barrett's esophagus/gastric ulcer, GERD.   Referred to the practice for evaluation of palpitations.  However, by her appointment her symptoms of palpitation had resolved.  However, she did have symptoms of precordial discomfort.  Workup illustrated severe coronary artery calcification, MPI low risk study, educated her on importance of improving her modifiable cardiovascular risk factors.  She presents today for 43-month follow-up visit.  She continues to have chest pain, substernally located, squeeze/pressure-like sensation, at times brought on by overexertion but not always, and usually self-limited.  However, please use sublingual nitroglycerin tablets 3 times since last office visit (1 tablet every occasion) which has provided her symptom relief.   She does have a history of heartburn as well as Barrett's esophagus and gastric ulcers which may be contributory.  She has not had a GI evaluation in the recent past.   FUNCTIONAL STATUS: No structured exercise program or daily routine.   ALLERGIES: No Known Allergies  MEDICATION LIST PRIOR TO VISIT: Current Meds  Medication Sig   aspirin EC 81 MG tablet Take 1 tablet (81 mg total) by mouth daily. Swallow whole.   dapagliflozin propanediol (FARXIGA) 10 MG TABS tablet Farxiga 10 mg tablet   DULoxetine HCl (CYMBALTA PO) Take 30 mg by mouth daily at 2 PM.   ezetimibe (ZETIA) 10 MG  tablet TAKE 1 TABLET BY MOUTH DAILY   lisinopril (ZESTRIL) 10 MG tablet Take 10 mg by mouth daily.   medroxyPROGESTERone (DEPO-PROVERA) 150 MG/ML injection Inject 150 mg into the muscle every 3 (three) months.   metFORMIN (GLUCOPHAGE) 1000 MG tablet Take 1,000 mg by mouth daily with breakfast.   MOUNJARO 2.5 MG/0.5ML Pen Inject into the skin.   omeprazole (PRILOSEC) 20 MG capsule Take 20 mg by mouth daily.   rosuvastatin (CRESTOR) 20 MG tablet Take 1 tablet (20 mg total) by mouth at bedtime.   [DISCONTINUED] metoprolol succinate (TOPROL-XL) 25 MG 24 hr tablet TAKE 1 TABLET BY MOUTH DAILY WITH OR IMMEDIATELY FOLLOWING A MEAL     PAST MEDICAL HISTORY: Past Medical History:  Diagnosis Date   Barrett esophagus    Diabetes mellitus without complication (HCC)    Hyperlipidemia    Hypertension    Post-operative nausea and vomiting    post op headache    PAST SURGICAL HISTORY: Past Surgical History:  Procedure Laterality Date   UPPER GASTROINTESTINAL ENDOSCOPY     10 years ago -[place], [place]    FAMILY HISTORY: The patient family history includes Alzheimer's disease in her father and maternal grandmother; Asthma in her sister; Cancer in her maternal grandfather, paternal aunt, paternal aunt, and paternal aunt; Colon cancer (age of onset: 47) in her sister; Colon polyps in her sister; Diabetes in her father; Emphysema in her paternal grandfather; Heart attack in her maternal grandmother; Hyperlipidemia in her mother; Hypertension in her mother; Pneumonia in her daughter; Stroke in her maternal grandmother; Thyroid disease in her mother.  SOCIAL HISTORY:  The patient  reports that she quit smoking about 9  years ago. Her smoking use included cigarettes. She has a 33.00 pack-year smoking history. She has never used smokeless tobacco. She reports that she does not drink alcohol and does not use drugs.  REVIEW OF SYSTEMS: Review of Systems  Cardiovascular:  Positive for chest pain. Negative for  claudication, dyspnea on exertion, irregular heartbeat, leg swelling, near-syncope, orthopnea, palpitations, paroxysmal nocturnal dyspnea and syncope.  Respiratory:  Negative for shortness of breath.   Hematologic/Lymphatic: Negative for bleeding problem.  Musculoskeletal:  Negative for muscle cramps and myalgias.  Gastrointestinal:  Positive for heartburn.  Neurological:  Negative for dizziness and light-headedness.    PHYSICAL EXAM:    11/06/2022    1:27 PM 05/16/2022    1:05 PM 05/02/2022    1:50 PM  Vitals with BMI  Height 5\' 5"  5\' 5"  5\' 6"   Weight 216 lbs 217 lbs 3 oz 216 lbs 6 oz  BMI 35.94 36.14 34.94  Systolic 121 109 161  Diastolic 77 71 79  Pulse 82 82 95   Physical Exam Cardiovascular:     Rate and Rhythm: Normal rate and regular rhythm.     Pulses: Normal pulses.     Heart sounds: Normal heart sounds. No murmur heard.    No gallop.  Pulmonary:     Effort: Pulmonary effort is normal.     Breath sounds: Normal breath sounds. No wheezing or rales.  Musculoskeletal:     Right lower leg: No edema.     Left lower leg: No edema.    RADIOLOGY:  Chest Xray 05/01/2022: FINDINGS: The heart and mediastinal contours are within normal limits. Slightly prominent right hilar vasculature likely due to patient rotation.   No focal consolidation. No pulmonary edema. No pleural effusion. No pneumothorax.   No acute osseous abnormality.   IMPRESSION: No active cardiopulmonary disease.  CARDIAC DATABASE: EKG Nov 06, 2022: Sinus rhythm, 80 bpm, without underlying ischemia or injury pattern.   Echocardiogram: 09/26/2021:  Left ventricle cavity is normal in size and wall thickness. Normal global wall motion. Normal LV systolic function with EF 59%. Normal diastolic filling pattern. No significant valvular abnormality.  Normal right atrial pressure.   Stress Testing: Exercise nuclear stress test 11/01/2021: The patient exercised for 3 minutes and 1 seconds of a Bruce  protocol, achieving approximately 4.64 METs and 88% of age predicted maximum heart rate. Exercise capacity was low. No chest pain reported. Accelerated heart rate. Normal hemodynamic response. Stress EKG revealed no ischemic changes. There is a very small sized moderate reversible defect in the apical region. This may also represent mild breast attenuation artifact.  Overall LV systolic function is normal without regional wall motion abnormalities. Stress LV EF: 61%.  No previous exam available for comparison. Low risk.  Heart Catheterization: None  LABORATORY DATA:     Latest Ref Rng & Units 05/01/2022    9:01 PM 02/07/2022    9:22 AM 12/20/2021    8:25 AM  BMP  Glucose 70 - 99 mg/dL 096  045  409   BUN 6 - 20 mg/dL 16  13  16    Creatinine 0.44 - 1.00 mg/dL 8.11  9.14  7.82   BUN/Creat Ratio 9 - 23  15  19    Sodium 135 - 145 mmol/L 139  137  136   Potassium 3.5 - 5.1 mmol/L 4.4  4.7  4.2   Chloride 98 - 111 mmol/L 104  98  102   CO2 22 - 32 mmol/L 24  19  20  Calcium 8.9 - 10.3 mg/dL 9.9  9.5  9.7    CBC    Component Value Date/Time   WBC 13.0 (H) 05/01/2022 2101   RBC 5.08 05/01/2022 2101   HGB 14.0 05/01/2022 2101   HGB 14.4 12/31/2019 1234   HCT 42.8 05/01/2022 2101   PLT 339 05/01/2022 2101   PLT 296 12/31/2019 1234   MCV 84.3 05/01/2022 2101   MCH 27.6 05/01/2022 2101   MCHC 32.7 05/01/2022 2101   RDW 14.0 05/01/2022 2101   LYMPHSABS 2.7 12/31/2019 1234   MONOABS 0.6 12/31/2019 1234   EOSABS 0.3 12/31/2019 1234   BASOSABS 0.1 12/31/2019 1234   Lipid Panel     Component Value Date/Time   CHOL 96 (L) 10/31/2022 0850   TRIG 98 10/31/2022 0850   HDL 42 10/31/2022 0850   LDLCALC 35 10/31/2022 0850   LDLDIRECT 49 02/07/2022 0921   LABVLDL 19 10/31/2022 0850   No results found for: "HGBA1C"  External Labs:  Date Collected: 08/31/2021 , information obtained by referring physician Potassium: 4.3 Creatinine 0.83 mg/dL. eGFR: 86 mL/min per 1.73 m Hemoglobin:  15.3 g/dL and hematocrit: 16.1 % AST: 19 , ALT: 24 , alkaline phosphatase: 129  Hemoglobin A1c: 10.8 TSH: 1.75    IMPRESSION:    ICD-10-CM   1. Precordial pain  R07.2 EKG 12-Lead    metoprolol succinate (TOPROL-XL) 25 MG 24 hr tablet    2. Coronary atherosclerosis due to calcified coronary lesion  I25.10 CBC   I25.84 Basic metabolic panel    metoprolol succinate (TOPROL-XL) 25 MG 24 hr tablet    3. Agatston CAC score, >400  R93.1 CBC    Basic metabolic panel    metoprolol succinate (TOPROL-XL) 25 MG 24 hr tablet    4. Benign hypertension  I10     5. Type 2 diabetes mellitus with hyperglycemia, without long-term current use of insulin (HCC)  E11.65     6. Type 2 diabetes mellitus with hyperlipidemia (HCC)  E11.69    E78.5     7. Class 2 severe obesity due to excess calories with serious comorbidity and body mass index (BMI) of 35.0 to 35.9 in adult [facility])  E66.01    Z68.35       RECOMMENDATIONS: Leslie Hanna is a 52 y.o. Caucasian female whose past medical history and cardiac risk factors include: Non-insulin-dependent diabetes w/ neuropathy, hypertension, hyperlipidemia, former smoker.   Precordial pain Agatston CAC score, >400 Coronary atherosclerosis due to calcified coronary lesion No active chest pain. But at times still continues to have precordial pain with cardiac and noncardiac features. EKG today is nonischemic. Has undergone echo and stress test back in 2023 results reviewed as part of medical decision making today. Given the fact that she is requiring sublingual nitroglycerin tablets for symptom relief, still continues to have discomfort despite initiating antianginal therapy, has had a history of poorly controlled diabetes and other cardiovascular risk factors balanced ischemia cannot be ruled out.  We discussed continued conservative management versus angiography to evaluate for obstructive disease. Patient would like to proceed with left heart  catheterization with possible intervention due to continued symptoms and multiple risk factors. Metoprolol refilled.  The procedure of left heart catheterization with possible intervention was explained to the patient in detail.  The indication, alternatives, risks and benefits were reviewed.  Complications include but not limited to bleeding, infection, vascular injury, stroke, myocardial infarction, arrhythmia (requiring medical or cardiopulmonary resuscitation), kidney injury (requiring short-term or long-term hemodialysis), radiation-related injury in  the case of prolonged fluoroscopy use, emergent cardiac surgery, temporary or permanent pacemaker, and death. The patient  understands the risks of serious complication is 1-2 in 1000 with diagnostic cardiac cath and 1-2% or less with angioplasty/stenting.  The patient voices understanding and provides verbal feedback her questions and concerns are addressed to her satisfaction and patient wishes to proceed with coronary angiography with possible PCI.  Benign hypertension Home and office blood pressure well-controlled. Continue current medical therapy. No changes warranted at this time.  GERD/prior history of Barrett's esophagus and gastric ulcers: Currently on medical therapy.   However, after completion of cardiac workup would recommend repeat GI evaluation. Patient agreeable with the plan of care. Continue home dose omeprazole for now.   FINAL MEDICATION LIST END OF ENCOUNTER: Meds ordered this encounter  Medications   metoprolol succinate (TOPROL-XL) 25 MG 24 hr tablet    Sig: TAKE 1 TABLET BY MOUTH DAILY WITH OR IMMEDIATELY FOLLOWING A MEAL    Dispense:  90 tablet    Refill:  1    Medications Discontinued During This Encounter  Medication Reason   DULoxetine (CYMBALTA) 60 MG capsule    metoprolol succinate (TOPROL-XL) 25 MG 24 hr tablet Reorder       Current Outpatient Medications:    aspirin EC 81 MG tablet, Take 1 tablet  (81 mg total) by mouth daily. Swallow whole., Disp: 30 tablet, Rfl: 12   dapagliflozin propanediol (FARXIGA) 10 MG TABS tablet, Farxiga 10 mg tablet, Disp: , Rfl:    DULoxetine HCl (CYMBALTA PO), Take 30 mg by mouth daily at 2 PM., Disp: , Rfl:    ezetimibe (ZETIA) 10 MG tablet, TAKE 1 TABLET BY MOUTH DAILY, Disp: 90 tablet, Rfl: 3   lisinopril (ZESTRIL) 10 MG tablet, Take 10 mg by mouth daily., Disp: , Rfl:    medroxyPROGESTERone (DEPO-PROVERA) 150 MG/ML injection, Inject 150 mg into the muscle every 3 (three) months., Disp: , Rfl:    metFORMIN (GLUCOPHAGE) 1000 MG tablet, Take 1,000 mg by mouth daily with breakfast., Disp: , Rfl:    MOUNJARO 2.5 MG/0.5ML Pen, Inject into the skin., Disp: , Rfl:    omeprazole (PRILOSEC) 20 MG capsule, Take 20 mg by mouth daily., Disp: , Rfl:    rosuvastatin (CRESTOR) 20 MG tablet, Take 1 tablet (20 mg total) by mouth at bedtime., Disp: 90 tablet, Rfl: 3   metoprolol succinate (TOPROL-XL) 25 MG 24 hr tablet, TAKE 1 TABLET BY MOUTH DAILY WITH OR IMMEDIATELY FOLLOWING A MEAL, Disp: 90 tablet, Rfl: 1   nitroGLYCERIN (NITROSTAT) 0.4 MG SL tablet, Place 1 tablet (0.4 mg total) under the tongue every 5 (five) minutes as needed for up to 25 days for chest pain., Disp: 25 tablet, Rfl: 3  Orders Placed This Encounter  Procedures   CBC   Basic metabolic panel   EKG 12-Lead    There are no Patient Instructions on file for this visit.   --Continue cardiac medications as reconciled in final medication list. --Return in about 4 weeks (around 12/04/2022) for Follow up, Post heart catheterization. Or sooner if needed. --Continue follow-up with your primary care physician regarding the management of your other chronic comorbid conditions.  Patient's questions and concerns were addressed to her satisfaction. She voices understanding of the instructions provided during this encounter.   This note was created using a voice recognition software as a result there may be  grammatical errors inadvertently enclosed that do not reflect the nature of this encounter. Every attempt is made  to correct such errors.  Tessa Lerner, [place], [facility]  Pager:  [phone] Office: [phone]

## 2022-11-10 LAB — BASIC METABOLIC PANEL
BUN/Creatinine Ratio: 16 (ref 9–23)
BUN: 14 mg/dL (ref 6–24)
CO2: 22 mmol/L (ref 20–29)
Calcium: 9.8 mg/dL (ref 8.7–10.2)
Chloride: 103 mmol/L (ref 96–106)
Creatinine, Ser: 0.89 mg/dL (ref 0.57–1.00)
Glucose: 228 mg/dL — ABNORMAL HIGH (ref 70–99)
Potassium: 5 mmol/L (ref 3.5–5.2)
Sodium: 140 mmol/L (ref 134–144)
eGFR: 78 mL/min/{1.73_m2} (ref >59)

## 2022-11-10 LAB — CBC
Hematocrit: 47.7 % — ABNORMAL HIGH (ref 34.0–46.6)
Hemoglobin: 14.7 g/dL (ref 11.1–15.9)
MCH: 27.1 pg (ref 26.6–33.0)
MCHC: 30.8 g/dL — ABNORMAL LOW (ref 31.5–35.7)
MCV: 88 fL (ref 79–97)
Platelets: 345 10*3/uL (ref 150–450)
RBC: 5.43 x10E6/uL — ABNORMAL HIGH (ref 3.77–5.28)
RDW: 14.4 % (ref 11.7–15.4)
WBC: 10 10*3/uL (ref 3.4–10.8)

## 2022-11-13 ENCOUNTER — Encounter (HOSPITAL_COMMUNITY)
Admission: RE | Disposition: A | Discharge status: Home / Self Care | Payer: Self-pay | Source: Home / Self Care | Attending: Cardiology

## 2022-11-13 ENCOUNTER — Ambulatory Visit (HOSPITAL_COMMUNITY)
Admission: RE | Admit: 2022-11-13 | Discharge: 2022-11-13 | Disposition: A | Discharge status: Home / Self Care | Payer: 59 | Attending: Cardiology | Admitting: Cardiology

## 2022-11-13 ENCOUNTER — Other Ambulatory Visit (HOSPITAL_COMMUNITY): Payer: Self-pay

## 2022-11-13 ENCOUNTER — Other Ambulatory Visit: Payer: Self-pay

## 2022-11-13 DIAGNOSIS — E1165 Type 2 diabetes mellitus with hyperglycemia: Secondary | ICD-10-CM | POA: Diagnosis not present

## 2022-11-13 DIAGNOSIS — E1169 Type 2 diabetes mellitus with other specified complication: Secondary | ICD-10-CM | POA: Diagnosis not present

## 2022-11-13 DIAGNOSIS — Z955 Presence of coronary angioplasty implant and graft: Secondary | ICD-10-CM

## 2022-11-13 DIAGNOSIS — Z8249 Family history of ischemic heart disease and other diseases of the circulatory system: Secondary | ICD-10-CM | POA: Diagnosis not present

## 2022-11-13 DIAGNOSIS — I25118 Atherosclerotic heart disease of native coronary artery with other forms of angina pectoris: Secondary | ICD-10-CM | POA: Diagnosis not present

## 2022-11-13 DIAGNOSIS — Z6835 Body mass index (BMI) 35.0-35.9, adult: Secondary | ICD-10-CM | POA: Diagnosis not present

## 2022-11-13 DIAGNOSIS — E785 Hyperlipidemia, unspecified: Secondary | ICD-10-CM | POA: Insufficient documentation

## 2022-11-13 DIAGNOSIS — E1140 Type 2 diabetes mellitus with diabetic neuropathy, unspecified: Secondary | ICD-10-CM | POA: Diagnosis not present

## 2022-11-13 DIAGNOSIS — Z7984 Long term (current) use of oral hypoglycemic drugs: Secondary | ICD-10-CM | POA: Insufficient documentation

## 2022-11-13 DIAGNOSIS — Z87891 Personal history of nicotine dependence: Secondary | ICD-10-CM | POA: Insufficient documentation

## 2022-11-13 DIAGNOSIS — I2584 Coronary atherosclerosis due to calcified coronary lesion: Secondary | ICD-10-CM | POA: Diagnosis not present

## 2022-11-13 DIAGNOSIS — I10 Essential (primary) hypertension: Secondary | ICD-10-CM | POA: Diagnosis not present

## 2022-11-13 DIAGNOSIS — Z01818 Encounter for other preprocedural examination: Secondary | ICD-10-CM

## 2022-11-13 HISTORY — PX: CORONARY PRESSURE/FFR STUDY: CATH118243

## 2022-11-13 HISTORY — PX: CORONARY ATHERECTOMY: CATH118238

## 2022-11-13 HISTORY — PX: LEFT HEART CATH AND CORONARY ANGIOGRAPHY: CATH118249

## 2022-11-13 HISTORY — PX: CORONARY STENT INTERVENTION: CATH118234

## 2022-11-13 HISTORY — PX: CORONARY ULTRASOUND/IVUS: CATH118244

## 2022-11-13 LAB — POCT ACTIVATED CLOTTING TIME
Activated Clotting Time: 352 seconds
Activated Clotting Time: 228 seconds
Activated Clotting Time: 250 seconds
Activated Clotting Time: 282 seconds
Activated Clotting Time: 233 seconds

## 2022-11-13 LAB — GLUCOSE, CAPILLARY
Glucose-Capillary: 206 mg/dL — ABNORMAL HIGH (ref 70–99)
Glucose-Capillary: 138 mg/dL — ABNORMAL HIGH (ref 70–99)

## 2022-11-13 LAB — PREGNANCY, URINE: Preg Test, Ur: NEGATIVE

## 2022-11-13 SURGERY — LEFT HEART CATH AND CORONARY ANGIOGRAPHY
Anesthesia: LOCAL

## 2022-11-13 MED ORDER — LABETALOL HCL 5 MG/ML IV SOLN: 10.0000 mg | INTRAVENOUS | Status: DC | PRN

## 2022-11-13 MED ORDER — SODIUM CHLORIDE 0.9 % WEIGHT BASED INFUSION
3.0000 mL/kg/h | INTRAVENOUS | Status: AC
  Administered 2022-11-13: 3 mL/kg/h via INTRAVENOUS

## 2022-11-13 MED ORDER — ASPIRIN 81 MG PO CHEW
81.0000 mg | CHEWABLE_TABLET | ORAL | Status: AC
  Administered 2022-11-13: 81 mg via ORAL
  Filled 2022-11-13: qty 1

## 2022-11-13 MED ORDER — IOHEXOL 350 MG/ML SOLN
INTRAVENOUS | Status: DC | PRN
  Administered 2022-11-13: 240 mL

## 2022-11-13 MED ORDER — SODIUM CHLORIDE 0.9% FLUSH: 3.0000 mL | INTRAVENOUS | Status: DC | PRN

## 2022-11-13 MED ORDER — FENTANYL CITRATE (PF) 100 MCG/2ML IJ SOLN
INTRAMUSCULAR | Status: AC
  Filled 2022-11-13: qty 2

## 2022-11-13 MED ORDER — CLOPIDOGREL BISULFATE 75 MG PO TABS
75.0000 mg | ORAL_TABLET | Freq: Every day | ORAL | Status: DC
Start: 2022-11-14 — End: 2022-11-13

## 2022-11-13 MED ORDER — VERAPAMIL HCL 2.5 MG/ML IV SOLN
INTRAVENOUS | Status: AC
  Filled 2022-11-13: qty 2

## 2022-11-13 MED ORDER — SODIUM CHLORIDE 0.9 % IV SOLN: 250.0000 mL | INTRAVENOUS | Status: DC | PRN

## 2022-11-13 MED ORDER — HEPARIN SODIUM (PORCINE) 1000 UNIT/ML IJ SOLN
INTRAMUSCULAR | Status: DC | PRN
  Administered 2022-11-13: 2000 [IU] via INTRAVENOUS
  Administered 2022-11-13: 3000 [IU] via INTRAVENOUS
  Administered 2022-11-13: 5000 [IU] via INTRAVENOUS
  Administered 2022-11-13: 2000 [IU] via INTRAVENOUS
  Administered 2022-11-13: 5000 [IU] via INTRAVENOUS

## 2022-11-13 MED ORDER — FAMOTIDINE IN NACL 20-0.9 MG/50ML-% IV SOLN
INTRAVENOUS | Status: AC
  Filled 2022-11-13: qty 50

## 2022-11-13 MED ORDER — ONDANSETRON HCL 4 MG/2ML IJ SOLN: 4.0000 mg | Freq: Four times a day (QID) | INTRAMUSCULAR | Status: DC | PRN

## 2022-11-13 MED ORDER — LIDOCAINE HCL (PF) 1 % IJ SOLN
INTRAMUSCULAR | Status: AC
  Filled 2022-11-13: qty 30

## 2022-11-13 MED ORDER — HEPARIN SODIUM (PORCINE) 5000 UNIT/ML IJ SOLN: 5000.0000 [IU] | Freq: Three times a day (TID) | INTRAMUSCULAR | Status: DC

## 2022-11-13 MED ORDER — HYDRALAZINE HCL 20 MG/ML IJ SOLN: 10.0000 mg | INTRAMUSCULAR | Status: DC | PRN

## 2022-11-13 MED ORDER — SODIUM CHLORIDE 0.9% FLUSH: 3.0000 mL | Freq: Two times a day (BID) | INTRAVENOUS | Status: DC

## 2022-11-13 MED ORDER — FENTANYL CITRATE (PF) 100 MCG/2ML IJ SOLN
INTRAMUSCULAR | Status: DC | PRN
  Administered 2022-11-13 (×4): 25 ug via INTRAVENOUS

## 2022-11-13 MED ORDER — NITROGLYCERIN 1 MG/10 ML FOR IR/CATH LAB
INTRA_ARTERIAL | Status: DC | PRN
  Administered 2022-11-13 (×3): 100 ug via INTRACORONARY
  Administered 2022-11-13: 200 ug via INTRACORONARY
  Administered 2022-11-13: 100 ug via INTRACORONARY
  Administered 2022-11-13 (×4): 200 ug via INTRACORONARY

## 2022-11-13 MED ORDER — ACETAMINOPHEN 325 MG PO TABS: 650.0000 mg | ORAL_TABLET | ORAL | Status: DC | PRN

## 2022-11-13 MED ORDER — CLOPIDOGREL BISULFATE 75 MG PO TABS
75.0000 mg | ORAL_TABLET | Freq: Every day | ORAL | 2 refills | Status: DC
  Filled 2022-11-13 – 2022-12-13 (×2): qty 30, 30d supply, fill #0

## 2022-11-13 MED ORDER — VERAPAMIL HCL 2.5 MG/ML IV SOLN
INTRAVENOUS | Status: DC | PRN
  Administered 2022-11-13: 10 mL via INTRA_ARTERIAL

## 2022-11-13 MED ORDER — HEPARIN SODIUM (PORCINE) 1000 UNIT/ML IJ SOLN
INTRAMUSCULAR | Status: AC
  Filled 2022-11-13: qty 10

## 2022-11-13 MED ORDER — FAMOTIDINE IN NACL 20-0.9 MG/50ML-% IV SOLN
INTRAVENOUS | Status: DC | PRN
  Administered 2022-11-13: 20 mg via INTRAVENOUS

## 2022-11-13 MED ORDER — NITROGLYCERIN 1 MG/10 ML FOR IR/CATH LAB
INTRA_ARTERIAL | Status: AC
  Filled 2022-11-13: qty 10

## 2022-11-13 MED ORDER — LIDOCAINE HCL (PF) 1 % IJ SOLN
INTRAMUSCULAR | Status: DC | PRN
  Administered 2022-11-13: 2 mL

## 2022-11-13 MED ORDER — SODIUM CHLORIDE 0.9 % IV SOLN: INTRAVENOUS | Status: DC

## 2022-11-13 MED ORDER — PANTOPRAZOLE SODIUM 40 MG PO TBEC
40.0000 mg | DELAYED_RELEASE_TABLET | Freq: Every day | ORAL | 2 refills | Status: DC
  Filled 2022-11-13 – 2022-12-13 (×2): qty 30, 30d supply, fill #0

## 2022-11-13 MED ORDER — VIPERSLIDE LUBRICANT OPTIME
TOPICAL | Status: DC | PRN
  Administered 2022-11-13: 20 mL via SURGICAL_CAVITY

## 2022-11-13 MED ORDER — CLOPIDOGREL BISULFATE 300 MG PO TABS
ORAL_TABLET | ORAL | Status: AC
  Filled 2022-11-13: qty 2

## 2022-11-13 MED ORDER — SODIUM CHLORIDE 0.9 % WEIGHT BASED INFUSION
1.0000 mL/kg/h | INTRAVENOUS | Status: DC
  Administered 2022-11-13: 250 mL via INTRAVENOUS
  Administered 2022-11-13: 500 mL via INTRAVENOUS

## 2022-11-13 MED ORDER — CLOPIDOGREL BISULFATE 300 MG PO TABS
ORAL_TABLET | ORAL | Status: DC | PRN
  Administered 2022-11-13: 600 mg via ORAL

## 2022-11-13 MED ORDER — MIDAZOLAM HCL 2 MG/2ML IJ SOLN
INTRAMUSCULAR | Status: AC
  Filled 2022-11-13: qty 2

## 2022-11-13 MED ORDER — MIDAZOLAM HCL 2 MG/2ML IJ SOLN
INTRAMUSCULAR | Status: DC | PRN
  Administered 2022-11-13 (×4): 1 mg via INTRAVENOUS

## 2022-11-13 MED ORDER — HEPARIN (PORCINE) IN NACL 1000-0.9 UT/500ML-% IV SOLN
INTRAVENOUS | Status: DC | PRN
  Administered 2022-11-13 (×2): 500 mL
  Administered 2022-11-13: 500 mL via INTRA_ARTERIAL

## 2022-11-13 SURGICAL SUPPLY — 39 items
BALL SAPPHIRE NC24 4.0X15 (BALLOONS) ×1
BALLN EMERGE MR 3.0X15 (BALLOONS) ×1
BALLN SCOREFLEX 3.50X15 (BALLOONS) ×1
BALLN ~~LOC~~ EMERGE MR 3.25X15 (BALLOONS) ×1
BALLN ~~LOC~~ EMERGE MR 3.75X15 (BALLOONS) ×1
BALLOON EMERGE MR 3.0X15 (BALLOONS) IMPLANT
BALLOON SAPPHIRE NC24 4.0X15 (BALLOONS) IMPLANT
BALLOON SCOREFLEX 3.50X15 (BALLOONS) IMPLANT
BALLOON ~~LOC~~ EMERGE MR 3.25X15 (BALLOONS) IMPLANT
BALLOON ~~LOC~~ EMERGE MR 3.75X15 (BALLOONS) IMPLANT
BAND CMPR LRG ZPHR (HEMOSTASIS) ×1
BAND ZEPHYR COMPRESS 30 LONG (HEMOSTASIS) IMPLANT
CATH INFINITI 5 FR JL3.5 (CATHETERS) IMPLANT
CATH INFINITI JR4 5F (CATHETERS) IMPLANT
CATH LAUNCHER 6FR EBU 3 (CATHETERS) IMPLANT
CATH OPTICROSS HD (CATHETERS) IMPLANT
CATH OPTITORQUE TIG 4.0 5F (CATHETERS) IMPLANT
CATH TELEPORT (CATHETERS) IMPLANT
CROWN DIAMONDBACK CLASSIC 1.25 (BURR) IMPLANT
GLIDESHEATH SLEND A-KIT 6F 22G (SHEATH) IMPLANT
GUIDEWIRE INQWIRE 1.5J.035X260 (WIRE) IMPLANT
GUIDEWIRE PRESSURE X 175 (WIRE) IMPLANT
INQWIRE 1.5J .035X260CM (WIRE) ×1
KIT ENCORE 26 ADVANTAGE (KITS) IMPLANT
KIT HEART LEFT (KITS) ×2 IMPLANT
KIT HEMO VALVE WATCHDOG (MISCELLANEOUS) IMPLANT
LUBRICANT VIPERSLIDE CORONARY (MISCELLANEOUS) IMPLANT
PACK CARDIAC CATHETERIZATION (CUSTOM PROCEDURE TRAY) ×2 IMPLANT
SHEATH PROBE COVER 6X72 (BAG) IMPLANT
SLED PULL BACK IVUS (MISCELLANEOUS) IMPLANT
STENT SYNERGY XD 3.0X24 (Permanent Stent) IMPLANT
STENT SYNERGY XD 3.50X24 (Permanent Stent) IMPLANT
SYNERGY XD 3.0X24 (Permanent Stent) ×1 IMPLANT
SYNERGY XD 3.50X24 (Permanent Stent) ×1 IMPLANT
TRANSDUCER W/STOPCOCK (MISCELLANEOUS) ×2 IMPLANT
TUBING CIL FLEX 10 FLL-RA (TUBING) ×2 IMPLANT
WIRE ASAHI PROWATER 180CM (WIRE) IMPLANT
WIRE ASAHI PROWATER 300CM (WIRE) IMPLANT
WIRE VIPERWIRE COR FLEX .012 (WIRE) IMPLANT

## 2022-11-13 NOTE — H&P (Signed)
OV 11/06/2022 copied for documentation    ID:  Leslie Hanna, DOB [birth year], MRN [id number]  PCP:  Debroah Baller, FNP  Cardiologist:  Tessa Lerner, DO, [facility]  (established care 09/12/2021)  Date: 11/06/22 Last Office Visit: 05/16/2022  Chief Complaint  Patient presents with   Follow-up   Chest Pain   HPI  Leslie Hanna is a 52 y.o. Caucasian female whose past medical history and cardiovascular risk factors include: Severe coronary artery calcification (total CAC 607, 99th percentile), non-insulin-dependent diabetes w/ neuropathy, hypertension, hyperlipidemia, former smoker, history of Barrett's esophagus/gastric ulcer, GERD.   Referred to the practice for evaluation of palpitations.  However, by her appointment her symptoms of palpitation had resolved.  However, she did have symptoms of precordial discomfort.  Workup illustrated severe coronary artery calcification, MPI low risk study, educated her on importance of improving her modifiable cardiovascular risk factors.  She presents today for 47-month follow-up visit.  She continues to have chest pain, substernally located, squeeze/pressure-like sensation, at times brought on by overexertion but not always, and usually self-limited.  However, please use sublingual nitroglycerin tablets 3 times since last office visit (1 tablet every occasion) which has provided her symptom relief.   She does have a history of heartburn as well as Barrett's esophagus and gastric ulcers which may be contributory.  She has not had a GI evaluation in the recent past.   FUNCTIONAL STATUS: No structured exercise program or daily routine.   ALLERGIES: No Known Allergies  MEDICATION LIST PRIOR TO VISIT: Current Meds  Medication Sig   aspirin EC 81 MG tablet Take 1 tablet (81 mg total) by mouth daily. Swallow whole.   dapagliflozin propanediol (FARXIGA) 10 MG TABS tablet Farxiga 10 mg tablet   DULoxetine HCl (CYMBALTA PO) Take 30 mg by mouth  daily at 2 PM.   ezetimibe (ZETIA) 10 MG tablet TAKE 1 TABLET BY MOUTH DAILY   lisinopril (ZESTRIL) 10 MG tablet Take 10 mg by mouth daily.   medroxyPROGESTERone (DEPO-PROVERA) 150 MG/ML injection Inject 150 mg into the muscle every 3 (three) months.   metFORMIN (GLUCOPHAGE) 1000 MG tablet Take 1,000 mg by mouth daily with breakfast.   MOUNJARO 2.5 MG/0.5ML Pen Inject into the skin.   omeprazole (PRILOSEC) 20 MG capsule Take 20 mg by mouth daily.   rosuvastatin (CRESTOR) 20 MG tablet Take 1 tablet (20 mg total) by mouth at bedtime.   [DISCONTINUED] metoprolol succinate (TOPROL-XL) 25 MG 24 hr tablet TAKE 1 TABLET BY MOUTH DAILY WITH OR IMMEDIATELY FOLLOWING A MEAL     PAST MEDICAL HISTORY: Past Medical History:  Diagnosis Date   Barrett esophagus    Diabetes mellitus without complication (HCC)    Hyperlipidemia    Hypertension    Post-operative nausea and vomiting    post op headache    PAST SURGICAL HISTORY: Past Surgical History:  Procedure Laterality Date   UPPER GASTROINTESTINAL ENDOSCOPY     10 years ago -[place], [place]    FAMILY HISTORY: The patient family history includes Alzheimer's disease in her father and maternal grandmother; Asthma in her sister; Cancer in her maternal grandfather, paternal aunt, paternal aunt, and paternal aunt; Colon cancer (age of onset: 1) in her sister; Colon polyps in her sister; Diabetes in her father; Emphysema in her paternal grandfather; Heart attack in her maternal grandmother; Hyperlipidemia in her mother; Hypertension in her mother; Pneumonia in her daughter; Stroke in her maternal grandmother; Thyroid disease in her mother.  SOCIAL HISTORY:  The patient  reports that she quit smoking about 9 years ago. Her smoking use included cigarettes. She has a 33.00 pack-year smoking history. She has never used smokeless tobacco. She reports that she does not drink alcohol and does not use drugs.  REVIEW OF SYSTEMS: Review of Systems   Cardiovascular:  Positive for chest pain. Negative for claudication, dyspnea on exertion, irregular heartbeat, leg swelling, near-syncope, orthopnea, palpitations, paroxysmal nocturnal dyspnea and syncope.  Respiratory:  Negative for shortness of breath.   Hematologic/Lymphatic: Negative for bleeding problem.  Musculoskeletal:  Negative for muscle cramps and myalgias.  Gastrointestinal:  Positive for heartburn.  Neurological:  Negative for dizziness and light-headedness.    PHYSICAL EXAM:    11/06/2022    1:27 PM 05/16/2022    1:05 PM 05/02/2022    1:50 PM  Vitals with BMI  Height 5\' 5"  5\' 5"  5\' 6"   Weight 216 lbs 217 lbs 3 oz 216 lbs 6 oz  BMI 35.94 36.14 34.94  Systolic 121 109 161  Diastolic 77 71 79  Pulse 82 82 95   Physical Exam Cardiovascular:     Rate and Rhythm: Normal rate and regular rhythm.     Pulses: Normal pulses.     Heart sounds: Normal heart sounds. No murmur heard.    No gallop.  Pulmonary:     Effort: Pulmonary effort is normal.     Breath sounds: Normal breath sounds. No wheezing or rales.  Musculoskeletal:     Right lower leg: No edema.     Left lower leg: No edema.    RADIOLOGY:  Chest Xray 05/01/2022: FINDINGS: The heart and mediastinal contours are within normal limits. Slightly prominent right hilar vasculature likely due to patient rotation.   No focal consolidation. No pulmonary edema. No pleural effusion. No pneumothorax.   No acute osseous abnormality.   IMPRESSION: No active cardiopulmonary disease.  CARDIAC DATABASE: EKG Nov 06, 2022: Sinus rhythm, 80 bpm, without underlying ischemia or injury pattern.   Echocardiogram: 09/26/2021:  Left ventricle cavity is normal in size and wall thickness. Normal global wall motion. Normal LV systolic function with EF 59%. Normal diastolic filling pattern. No significant valvular abnormality.  Normal right atrial pressure.   Stress Testing: Exercise nuclear stress test 11/01/2021: The  patient exercised for 3 minutes and 1 seconds of a Bruce protocol, achieving approximately 4.64 METs and 88% of age predicted maximum heart rate. Exercise capacity was low. No chest pain reported. Accelerated heart rate. Normal hemodynamic response. Stress EKG revealed no ischemic changes. There is a very small sized moderate reversible defect in the apical region. This may also represent mild breast attenuation artifact.  Overall LV systolic function is normal without regional wall motion abnormalities. Stress LV EF: 61%.  No previous exam available for comparison. Low risk.  Heart Catheterization: None  LABORATORY DATA:     Latest Ref Rng & Units 05/01/2022    9:01 PM 02/07/2022    9:22 AM 12/20/2021    8:25 AM  BMP  Glucose 70 - 99 mg/dL 096  045  409   BUN 6 - 20 mg/dL 16  13  16    Creatinine 0.44 - 1.00 mg/dL 8.11  9.14  7.82   BUN/Creat Ratio 9 - 23  15  19    Sodium 135 - 145 mmol/L 139  137  136   Potassium 3.5 - 5.1 mmol/L 4.4  4.7  4.2   Chloride 98 - 111 mmol/L 104  98  102   CO2 22 -  32 mmol/L 24  19  20    Calcium 8.9 - 10.3 mg/dL 9.9  9.5  9.7    CBC    Component Value Date/Time   WBC 13.0 (H) 05/01/2022 2101   RBC 5.08 05/01/2022 2101   HGB 14.0 05/01/2022 2101   HGB 14.4 12/31/2019 1234   HCT 42.8 05/01/2022 2101   PLT 339 05/01/2022 2101   PLT 296 12/31/2019 1234   MCV 84.3 05/01/2022 2101   MCH 27.6 05/01/2022 2101   MCHC 32.7 05/01/2022 2101   RDW 14.0 05/01/2022 2101   LYMPHSABS 2.7 12/31/2019 1234   MONOABS 0.6 12/31/2019 1234   EOSABS 0.3 12/31/2019 1234   BASOSABS 0.1 12/31/2019 1234   Lipid Panel     Component Value Date/Time   CHOL 96 (L) 10/31/2022 0850   TRIG 98 10/31/2022 0850   HDL 42 10/31/2022 0850   LDLCALC 35 10/31/2022 0850   LDLDIRECT 49 02/07/2022 0921   LABVLDL 19 10/31/2022 0850   No results found for: "HGBA1C"  External Labs:  Date Collected: 08/31/2021 , information obtained by referring physician Potassium: 4.3 Creatinine  0.83 mg/dL. eGFR: 86 mL/min per 1.73 m Hemoglobin: 15.3 g/dL and hematocrit: 16.1 % AST: 19 , ALT: 24 , alkaline phosphatase: 129  Hemoglobin A1c: 10.8 TSH: 1.75    IMPRESSION:    ICD-10-CM   1. Precordial pain  R07.2 EKG 12-Lead    metoprolol succinate (TOPROL-XL) 25 MG 24 hr tablet    2. Coronary atherosclerosis due to calcified coronary lesion  I25.10 CBC   I25.84 Basic metabolic panel    metoprolol succinate (TOPROL-XL) 25 MG 24 hr tablet    3. Agatston CAC score, >400  R93.1 CBC    Basic metabolic panel    metoprolol succinate (TOPROL-XL) 25 MG 24 hr tablet    4. Benign hypertension  I10     5. Type 2 diabetes mellitus with hyperglycemia, without long-term current use of insulin (HCC)  E11.65     6. Type 2 diabetes mellitus with hyperlipidemia (HCC)  E11.69    E78.5     7. Class 2 severe obesity due to excess calories with serious comorbidity and body mass index (BMI) of 35.0 to 35.9 in adult [facility])  E66.01    Z68.35       RECOMMENDATIONS: Leslie Hanna is a 52 y.o. Caucasian female whose past medical history and cardiac risk factors include: Non-insulin-dependent diabetes w/ neuropathy, hypertension, hyperlipidemia, former smoker.   Precordial pain Agatston CAC score, >400 Coronary atherosclerosis due to calcified coronary lesion No active chest pain. But at times still continues to have precordial pain with cardiac and noncardiac features. EKG today is nonischemic. Has undergone echo and stress test back in 2023 results reviewed as part of medical decision making today. Given the fact that she is requiring sublingual nitroglycerin tablets for symptom relief, still continues to have discomfort despite initiating antianginal therapy, has had a history of poorly controlled diabetes and other cardiovascular risk factors balanced ischemia cannot be ruled out.  We discussed continued conservative management versus angiography to evaluate for obstructive  disease. Patient would like to proceed with left heart catheterization with possible intervention due to continued symptoms and multiple risk factors. Metoprolol refilled.  The procedure of left heart catheterization with possible intervention was explained to the patient in detail.  The indication, alternatives, risks and benefits were reviewed.  Complications include but not limited to bleeding, infection, vascular injury, stroke, myocardial infarction, arrhythmia (requiring medical or cardiopulmonary resuscitation), kidney  injury (requiring short-term or long-term hemodialysis), radiation-related injury in the case of prolonged fluoroscopy use, emergent cardiac surgery, temporary or permanent pacemaker, and death. The patient  understands the risks of serious complication is 1-2 in 1000 with diagnostic cardiac cath and 1-2% or less with angioplasty/stenting.  The patient voices understanding and provides verbal feedback her questions and concerns are addressed to her satisfaction and patient wishes to proceed with coronary angiography with possible PCI.  Benign hypertension Home and office blood pressure well-controlled. Continue current medical therapy. No changes warranted at this time.  GERD/prior history of Barrett's esophagus and gastric ulcers: Currently on medical therapy.   However, after completion of cardiac workup would recommend repeat GI evaluation. Patient agreeable with the plan of care. Continue home dose omeprazole for now.   FINAL MEDICATION LIST END OF ENCOUNTER: Meds ordered this encounter  Medications   metoprolol succinate (TOPROL-XL) 25 MG 24 hr tablet    Sig: TAKE 1 TABLET BY MOUTH DAILY WITH OR IMMEDIATELY FOLLOWING A MEAL    Dispense:  90 tablet    Refill:  1    Medications Discontinued During This Encounter  Medication Reason   DULoxetine (CYMBALTA) 60 MG capsule    metoprolol succinate (TOPROL-XL) 25 MG 24 hr tablet Reorder       Current Outpatient  Medications:    aspirin EC 81 MG tablet, Take 1 tablet (81 mg total) by mouth daily. Swallow whole., Disp: 30 tablet, Rfl: 12   dapagliflozin propanediol (FARXIGA) 10 MG TABS tablet, Farxiga 10 mg tablet, Disp: , Rfl:    DULoxetine HCl (CYMBALTA PO), Take 30 mg by mouth daily at 2 PM., Disp: , Rfl:    ezetimibe (ZETIA) 10 MG tablet, TAKE 1 TABLET BY MOUTH DAILY, Disp: 90 tablet, Rfl: 3   lisinopril (ZESTRIL) 10 MG tablet, Take 10 mg by mouth daily., Disp: , Rfl:    medroxyPROGESTERone (DEPO-PROVERA) 150 MG/ML injection, Inject 150 mg into the muscle every 3 (three) months., Disp: , Rfl:    metFORMIN (GLUCOPHAGE) 1000 MG tablet, Take 1,000 mg by mouth daily with breakfast., Disp: , Rfl:    MOUNJARO 2.5 MG/0.5ML Pen, Inject into the skin., Disp: , Rfl:    omeprazole (PRILOSEC) 20 MG capsule, Take 20 mg by mouth daily., Disp: , Rfl:    rosuvastatin (CRESTOR) 20 MG tablet, Take 1 tablet (20 mg total) by mouth at bedtime., Disp: 90 tablet, Rfl: 3   metoprolol succinate (TOPROL-XL) 25 MG 24 hr tablet, TAKE 1 TABLET BY MOUTH DAILY WITH OR IMMEDIATELY FOLLOWING A MEAL, Disp: 90 tablet, Rfl: 1   nitroGLYCERIN (NITROSTAT) 0.4 MG SL tablet, Place 1 tablet (0.4 mg total) under the tongue every 5 (five) minutes as needed for up to 25 days for chest pain., Disp: 25 tablet, Rfl: 3  Orders Placed This Encounter  Procedures   CBC   Basic metabolic panel   EKG 12-Lead    There are no Patient Instructions on file for this visit.   --Continue cardiac medications as reconciled in final medication list. --Return in about 4 weeks (around 12/04/2022) for Follow up, Post heart catheterization. Or sooner if needed. --Continue follow-up with your primary care physician regarding the management of your other chronic comorbid conditions.  Patient's questions and concerns were addressed to her satisfaction. She voices understanding of the instructions provided during this encounter.   This note was created using a  voice recognition software as a result there may be grammatical errors inadvertently enclosed that do not reflect  the nature of this encounter. Every attempt is made to correct such errors.  Tessa Lerner, [place], [facility]  Pager:  [phone] Office: [phone]

## 2022-11-13 NOTE — Progress Notes (Signed)
CARDIAC REHAB PHASE I   Post stent education including site care, restrictions, risk factors, antiplatelet therapy importance, exercise guidelines, NTG use, heart healthy diabetic diet and CRP2 reviewed. All questions and concerns addressed. Referral sent to [facility] for CRP2. Plan for home later today.   [id number]  Woodroe Chen, RN BSN 11/13/2022 2:49 PM

## 2022-11-14 ENCOUNTER — Ambulatory Visit: Payer: Managed Care, Other (non HMO)

## 2022-11-14 ENCOUNTER — Encounter (HOSPITAL_COMMUNITY): Payer: Self-pay | Admitting: Cardiology

## 2022-11-15 ENCOUNTER — Ambulatory Visit: Payer: Managed Care, Other (non HMO) | Admitting: Cardiology

## 2022-11-16 ENCOUNTER — Other Ambulatory Visit: Payer: Self-pay | Admitting: Cardiology

## 2022-11-16 DIAGNOSIS — I2510 Atherosclerotic heart disease of native coronary artery without angina pectoris: Secondary | ICD-10-CM

## 2022-11-16 DIAGNOSIS — R931 Abnormal findings on diagnostic imaging of heart and coronary circulation: Secondary | ICD-10-CM

## 2022-11-20 ENCOUNTER — Telehealth (HOSPITAL_COMMUNITY): Payer: Self-pay

## 2022-11-20 NOTE — Telephone Encounter (Signed)
Pt returned CR phone call and stated she is not interested in CR.   Closed referral

## 2022-11-20 NOTE — Telephone Encounter (Signed)
Attempted to call patient in regards to Cardiac Rehab - LM on VM 

## 2022-11-29 ENCOUNTER — Ambulatory Visit: Payer: 59 | Admitting: Cardiology

## 2022-11-29 ENCOUNTER — Encounter: Payer: Self-pay | Admitting: Cardiology

## 2022-11-29 VITALS — BP 104/71 | HR 81 | Ht 65.0 in | Wt 215.0 lb

## 2022-11-29 DIAGNOSIS — I10 Essential (primary) hypertension: Secondary | ICD-10-CM

## 2022-11-29 DIAGNOSIS — Z955 Presence of coronary angioplasty implant and graft: Secondary | ICD-10-CM

## 2022-11-29 DIAGNOSIS — E1169 Type 2 diabetes mellitus with other specified complication: Secondary | ICD-10-CM

## 2022-11-29 DIAGNOSIS — I2510 Atherosclerotic heart disease of native coronary artery without angina pectoris: Secondary | ICD-10-CM

## 2022-11-29 DIAGNOSIS — E1165 Type 2 diabetes mellitus with hyperglycemia: Secondary | ICD-10-CM

## 2022-11-29 DIAGNOSIS — R931 Abnormal findings on diagnostic imaging of heart and coronary circulation: Secondary | ICD-10-CM

## 2022-11-29 NOTE — Progress Notes (Signed)
ID:  Leslie Hanna, DOB 1970-12-29, MRN [id number]  PCP:  Debroah Baller, FNP  Cardiologist:  Tessa Lerner, DO, [facility]  (established care 09/12/2021)  Date: 11/29/22 Last Office Visit: 11/06/2022  Chief Complaint  Patient presents with   post cath f/u   Follow-up   HPI  Leslie Hanna is a 52 y.o. Caucasian female whose past medical history and cardiovascular risk factors include: CAD s/p PCIx2 mid LAD (May 2024), Severe coronary artery calcification (total CAC 607, 99th percentile), non-insulin-dependent diabetes w/ neuropathy, hypertension, hyperlipidemia, former smoker, history of Barrett's esophagus/gastric ulcer, GERD.   Patient is being followed by the practice for palpitations/CAD.  At the last office visit she came in for 76-month follow-up but was endorsing precordial discomfort concerning for anginal chest pain.  She has undergone appropriate cardiovascular workup but given her young age, severe CAC, diabetes shared decision was to proceed with angiography.  She was noted to have obstructive disease and underwent coronary intervention.  She now presents for follow-up.  Since coronary intervention patient states that she has not had any reoccurrence of chest pain.  No longer requiring sublingual nitroglycerin tablet.  Her most recent hemoglobin A1c was 8.9 as of May 2024 currently being managed by PCP.  LDL and blood pressures are well-controlled on current medical therapy.  She is already on [facility] for weight loss and states that she has not had much success one of the rate limiting factor is his medication access.  In addition, cardiac rehab did reach out however she is delaying initiating cardiac rehab as her son needs attention.  FUNCTIONAL STATUS: No structured exercise program or daily routine.   ALLERGIES: No Known Allergies  MEDICATION LIST PRIOR TO VISIT: Current Meds  Medication Sig   ASPIRIN LOW DOSE 81 MG tablet SWALLOW ONE WHOLE TABLET BY MOUTH DAILY  AS DIRECTED BY DOCTOR   clopidogrel (PLAVIX) 75 MG tablet Take 1 tablet (75 mg total) by mouth daily with breakfast.   dapagliflozin propanediol (FARXIGA) 10 MG TABS tablet Take 10 mg by mouth daily.   DULoxetine (CYMBALTA) 60 MG capsule Take 60 mg by mouth daily.   ezetimibe (ZETIA) 10 MG tablet TAKE 1 TABLET BY MOUTH DAILY   lisinopril (ZESTRIL) 10 MG tablet Take 10 mg by mouth daily.   medroxyPROGESTERone (DEPO-PROVERA) 150 MG/ML injection Inject 150 mg into the muscle every 3 (three) months.   metFORMIN (GLUCOPHAGE-XR) 500 MG 24 hr tablet Take 1,000 mg by mouth daily.   metoprolol succinate (TOPROL-XL) 25 MG 24 hr tablet TAKE 1 TABLET BY MOUTH DAILY WITH OR IMMEDIATELY FOLLOWING A MEAL   nitroGLYCERIN (NITROSTAT) 0.4 MG SL tablet Place 1 tablet (0.4 mg total) under the tongue every 5 (five) minutes as needed for up to 25 days for chest pain.   pantoprazole (PROTONIX) 40 MG tablet Take 1 tablet (40 mg total) by mouth daily before breakfast.   rosuvastatin (CRESTOR) 20 MG tablet Take 1 tablet (20 mg total) by mouth at bedtime.   tirzepatide [facility]) 7.5 MG/0.5ML Pen Inject 7.5 mg into the skin every Thursday.     PAST MEDICAL HISTORY: Past Medical History:  Diagnosis Date   Barrett esophagus    Diabetes mellitus without complication (HCC)    Hyperlipidemia    Hypertension    Post-operative nausea and vomiting    post op headache    PAST SURGICAL HISTORY: Past Surgical History:  Procedure Laterality Date   CORONARY ATHERECTOMY N/A 11/13/2022   Procedure: CORONARY ATHERECTOMY;  Surgeon: Truett Mainland  J, MD;  Location: [facility] IN[facility];  Service: Cardiovascular;  Laterality: N/A;   CORONARY PRESSURE/FFR STUDY N/A 11/13/2022   Procedure: CORONARY PRESSURE/FFR STUDY;  Surgeon: Elder Negus, MD;  Location: [facility] INVASIVE CV LAB;  Service: Cardiovascular;  Laterality: N/A;   CORONARY STENT INTERVENTION N/A 11/13/2022   Procedure: CORONARY STENT INTERVENTION;  Surgeon:  Elder Negus, MD;  Location: [facility] INVAS[facility] CV [facility];  Service: Cardiovascular;  Laterality: N/A;   CORONARY ULTRASOUND/IVUS N/A 11/13/2022   Procedure: Coronary Ultrasound/IVUS;  Surgeon: Elder Negus, MD;  Location: [facility] INVAS[facility] CV [facility];  Service: Cardiovascular;  Laterality: N/A;   LEFT HEART CATH AND CORONARY ANGIOGRAPHY N/A 11/13/2022   Procedure: LEFT HEART CATH AND CORONARY ANGIOGRAPHY;  Surgeon: Elder Negus, MD;  Location: [facility];  Service: Cardiovascular;  Laterality: N/A;   UPPER GASTROINTESTINAL ENDOSCOPY     10 years ago -[place], [place]    FAMILY HISTORY: The patient family history includes Alzheimer's disease in her father and maternal grandmother; Asthma in her sister; Cancer in her maternal grandfather, paternal aunt, paternal aunt, and paternal aunt; Colon cancer (age of onset: 74) in her sister; Colon polyps in her sister; Diabetes in her father; Emphysema in her paternal grandfather; Heart attack in her maternal grandmother; Hyperlipidemia in her mother; Hypertension in her mother; Pneumonia in her daughter; Stroke in her maternal grandmother; Thyroid disease in her mother.  SOCIAL HISTORY:  The patient  reports that she quit smoking about 9 years ago. Her smoking use included cigarettes. She has a 33.00 pack-year smoking history. She has never used smokeless tobacco. She reports that she does not drink alcohol and does not use drugs.  REVIEW OF SYSTEMS: Review of Systems  Cardiovascular:  Negative for chest pain, claudication, dyspnea on exertion, irregular heartbeat, leg swelling, near-syncope, orthopnea, palpitations, paroxysmal nocturnal dyspnea and syncope.  Respiratory:  Negative for shortness of breath.   Hematologic/Lymphatic: Negative for bleeding problem.  Musculoskeletal:  Negative for muscle cramps and myalgias.  Gastrointestinal:  Negative for heartburn.  Neurological:  Negative for dizziness and light-headedness.    PHYSICAL  EXAM:    11/29/2022   10:32 AM 11/13/2022    4:45 PM 11/13/2022    4:00 PM  Vitals with BMI  Height 5\' 5"     Weight 215 lbs    BMI 35.78    Systolic 104 126 161  Diastolic 71 81 77  Pulse 81 80 78   Physical Exam  Constitutional: No distress.  Age appropriate, hemodynamically stable.   Neck: No JVD present.  Cardiovascular: Normal rate, regular rhythm, S1 normal, S2 normal, intact distal pulses and normal pulses. Exam reveals no gallop, no S3 and no S4.  No murmur heard. Pulmonary/Chest: Effort normal and breath sounds normal. No stridor. She has no wheezes. She has no rales.  Abdominal: Soft. Bowel sounds are normal. She exhibits no distension. There is no abdominal tenderness.  Musculoskeletal:        General: No edema.     Cervical back: Neck supple.  Neurological: She is alert and oriented to person, place, and time. She has intact cranial nerves (2-12).  Skin: Skin is warm and moist.   RADIOLOGY:  Chest Xray 05/01/2022: FINDINGS: The heart and mediastinal contours are within normal limits. Slightly prominent right hilar vasculature likely due to patient rotation.   No focal consolidation. No pulmonary edema. No pleural effusion. No pneumothorax.   No acute osseous abnormality.   IMPRESSION: No active cardiopulmonary disease.  CARDIAC DATABASE: EKG November 29, 2022: Sinus rhythm, 81 bpm, poor R wave progression, without underlying injury pattern.  Echocardiogram: 09/26/2021:  Left ventricle cavity is normal in size and wall thickness. Normal global wall motion. Normal LV systolic function with EF 59%. Normal diastolic filling pattern. No significant valvular abnormality.  Normal right atrial pressure.   Stress Testing: Exercise nuclear stress test 11/01/2021: The patient exercised for 3 minutes and 1 seconds of a Bruce protocol, achieving approximately 4.64 METs and 88% of age predicted maximum heart rate. Exercise capacity was low. No chest pain reported.  Accelerated heart rate. Normal hemodynamic response. Stress EKG revealed no ischemic changes. There is a very small sized moderate reversible defect in the apical region. This may also represent mild breast attenuation artifact.  Overall LV systolic function is normal without regional wall motion abnormalities. Stress LV EF: 61%.  No previous exam available for comparison. Low risk.  Heart Catheterization: 11/13/2022 LM: Ostial 20% stenosis LAD: Prox mild disease         Mid calcific 80% stenosis, followed by 70% stenosis         Distal 50% disease         Pre PCI iFR 0.79, with biggest step up in calcific section of mid LAD Lcx: Ostial 20%, prox 50% stenosis. iFR 0.95 (non-significant) RCA: Mild luminal irregularities       Successful percutaneous coronary intervention mid LAD     Orbital atherectomy, PTCA and overlapping stents placement     PTCA and stents placement Synergy DES 3.5 X 24 mm and 3.0 X 24 mm drug-eluting stents     Post dilatation with 3.75 and 3.25 mm [place] balloons up to 18 atm   Post atherectomy IVUS MSA 3.7 mm2 Post PCI IVUS MSA >8 mm2   RFR LAD: Pre PCI: 0.79 Post PCI 1st stent: 0.83 Post PCI 2nd stent: 0.91   LABORATORY DATA:     Latest Ref Rng & Units 11/09/2022    8:48 AM 05/01/2022    9:01 PM 02/07/2022    9:22 AM  BMP  Glucose 70 - 99 mg/dL 161  096  045   BUN 6 - 24 mg/dL 14  16  13    Creatinine 0.57 - 1.00 mg/dL 4.09  8.11  9.14   BUN/Creat Ratio 9 - 23 16   15    Sodium 134 - 144 mmol/L 140  139  137   Potassium 3.5 - 5.2 mmol/L 5.0  4.4  4.7   Chloride 96 - 106 mmol/L 103  104  98   CO2 20 - 29 mmol/L 22  24  19    Calcium 8.7 - 10.2 mg/dL 9.8  9.9  9.5    CBC Lab Results  Component Value Date   WBC 10.0 11/09/2022   HGB 14.7 11/09/2022   HCT 47.7 (H) 11/09/2022   MCV 88 11/09/2022   PLT 345 11/09/2022    Lipid Panel     Component Value Date/Time   CHOL 96 (L) 10/31/2022 0850   TRIG 98 10/31/2022 0850   HDL 42 10/31/2022 0850    LDLCALC 35 10/31/2022 0850   LDLDIRECT 49 02/07/2022 0921   LABVLDL 19 10/31/2022 0850   No results found for: "HGBA1C"  External Labs:  Date Collected: 08/31/2021 , information obtained by referring physician Potassium: 4.3 Creatinine 0.83 mg/dL. eGFR: 86 mL/min per 1.73 m Hemoglobin: 15.3 g/dL and hematocrit: 78.2 % AST: 19 , ALT: 24 , alkaline phosphatase: 129  Hemoglobin A1c: 10.8 TSH:  1.75    IMPRESSION:    ICD-10-CM   1. S/P coronary artery stent placement  Z95.5 EKG 12-Lead      RECOMMENDATIONS: Ayzel Stigall is a 52 y.o. Caucasian female whose past medical history and cardiac risk factors include: Non-insulin-dependent diabetes w/ neuropathy, hypertension, hyperlipidemia, former smoker.   Atherosclerosis of native coronary artery of native heart without angina pectoris S/P coronary artery stent placement Coronary atherosclerosis due to calcified coronary lesion Agatston CAC score, >400 Denies anginal factors. EKG: Nonischemic. Status post PCI to the mid LAD x 2. Currently on dual antiplatelet therapy. No use of sublingual nitroglycerin tablets since the last office visit. Most recent hemoglobin A1c 8.9 as of May 2024.  Reemphasized the importance of glycemic control. Most recent LDL within normal limits. Blood pressure is also well-controlled. Reemphasized importance of cardiac rehab and weight loss  Benign hypertension Medications reconciled. No changes warranted at this time.  Type 2 diabetes mellitus with hyperglycemia, without long-term current use of insulin (HCC) Type 2 diabetes mellitus with hyperlipidemia (HCC) Patient is educated on the importance of glycemic control in setting of CAD status post stents. Her LDL levels are already at goal. Currently on lisinopril, Zetia, statin therapy, Mounjaro  Class 2 severe obesity due to excess calories with serious comorbidity and body mass index (BMI) of 35.0 to 35.9 in adult [facility]) Body mass index is  35.78 kg/m. I reviewed with her importance of diet, regular physical activity/exercise, weight loss.   Patient is educated on the importance of increasing physical activity gradually as tolerated with a goal of moderate intensity exercise for 30 minutes a day 5 days a week.  FINAL MEDICATION LIST END OF ENCOUNTER: No orders of the defined types were placed in this encounter.   There are no discontinued medications.      Current Outpatient Medications:    ASPIRIN LOW DOSE 81 MG tablet, SWALLOW ONE WHOLE TABLET BY MOUTH DAILY AS DIRECTED BY DOCTOR, Disp: 30 tablet, Rfl: 12   clopidogrel (PLAVIX) 75 MG tablet, Take 1 tablet (75 mg total) by mouth daily with breakfast., Disp: 30 tablet, Rfl: 2   dapagliflozin propanediol (FARXIGA) 10 MG TABS tablet, Take 10 mg by mouth daily., Disp: , Rfl:    DULoxetine (CYMBALTA) 60 MG capsule, Take 60 mg by mouth daily., Disp: , Rfl:    ezetimibe (ZETIA) 10 MG tablet, TAKE 1 TABLET BY MOUTH DAILY, Disp: 90 tablet, Rfl: 3   lisinopril (ZESTRIL) 10 MG tablet, Take 10 mg by mouth daily., Disp: , Rfl:    medroxyPROGESTERone (DEPO-PROVERA) 150 MG/ML injection, Inject 150 mg into the muscle every 3 (three) months., Disp: , Rfl:    metFORMIN (GLUCOPHAGE-XR) 500 MG 24 hr tablet, Take 1,000 mg by mouth daily., Disp: , Rfl:    metoprolol succinate (TOPROL-XL) 25 MG 24 hr tablet, TAKE 1 TABLET BY MOUTH DAILY WITH OR IMMEDIATELY FOLLOWING A MEAL, Disp: 90 tablet, Rfl: 1   nitroGLYCERIN (NITROSTAT) 0.4 MG SL tablet, Place 1 tablet (0.4 mg total) under the tongue every 5 (five) minutes as needed for up to 25 days for chest pain., Disp: 25 tablet, Rfl: 3   pantoprazole (PROTONIX) 40 MG tablet, Take 1 tablet (40 mg total) by mouth daily before breakfast., Disp: 30 tablet, Rfl: 2   rosuvastatin (CRESTOR) 20 MG tablet, Take 1 tablet (20 mg total) by mouth at bedtime., Disp: 90 tablet, Rfl: 3   tirzepatide (MOUNJARO) 7.5 MG/0.5ML Pen, Inject 7.5 mg into the skin every  Thursday., Disp: ,  Rfl:   Orders Placed This Encounter  Procedures   EKG 12-Lead    There are no Patient Instructions on file for this visit.   --Continue cardiac medications as reconciled in final medication list. --No follow-ups on file. Or sooner if needed. --Continue follow-up with your primary care physician regarding the management of your other chronic comorbid conditions.  Patient's questions and concerns were addressed to her satisfaction. She voices understanding of the instructions provided during this encounter.   This note was created using a voice recognition software as a result there may be grammatical errors inadvertently enclosed that do not reflect the nature of this encounter. Every attempt is made to correct such errors.  Tessa Lerner, [place], [facility]  Pager:  [phone] Office: [phone]

## 2022-12-15 ENCOUNTER — Other Ambulatory Visit (HOSPITAL_COMMUNITY): Payer: Self-pay

## 2022-12-19 ENCOUNTER — Other Ambulatory Visit (HOSPITAL_COMMUNITY): Payer: Self-pay

## 2022-12-28 ENCOUNTER — Other Ambulatory Visit (HOSPITAL_COMMUNITY): Payer: Self-pay

## 2023-01-01 ENCOUNTER — Other Ambulatory Visit (HOSPITAL_COMMUNITY): Payer: Self-pay

## 2023-02-04 IMAGING — CT CT CARDIAC CORONARY ARTERY CALCIUM SCORE
3 series · 14 of 20 positions shown, 16 images · non-contrast
Comparison: None.

CLINICAL DATA: 50-year-old female with history of hyperlipidemia.
Coronary calcium scoring.

EXAM:
CT CARDIAC CORONARY ARTERY CALCIUM SCORE
TECHNIQUE: Non-contrast imaging through the heart was performed using
prospective ECG gating. Image post processing was performed on an
independent workstation, allowing for quantitative analysis of the
heart and coronary arteries. Note that this exam targets the heart
and the chest was not imaged in its entirety.

[Series 2: calcium scoring 2.00 qr36 bestdiast 70% hrt calciu · axial · 0.40mm/px · z∈[+1587,+1671]mm · 4 of 70 slices shown]
[im 14/70  vessel]
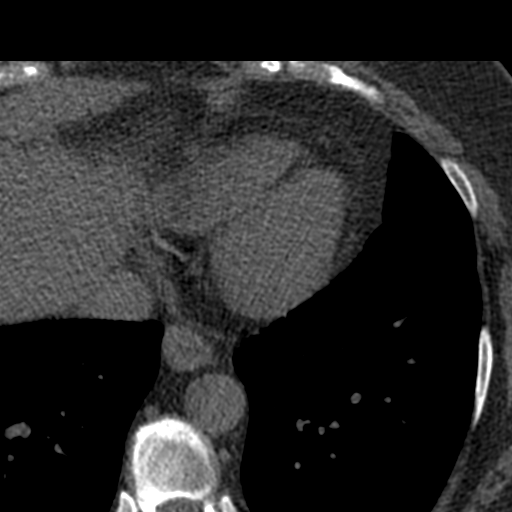
[im 28/70  vessel]
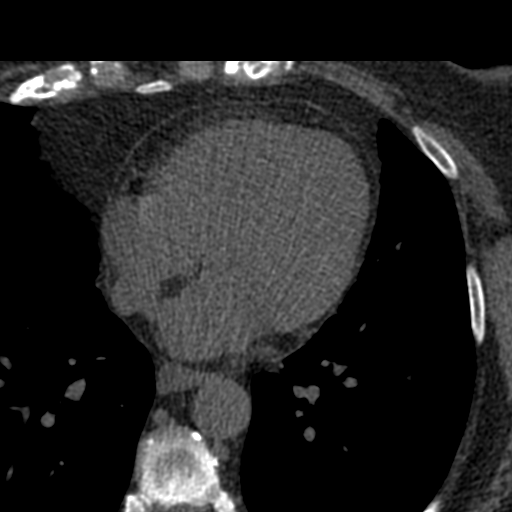
[im 42/70  vessel]
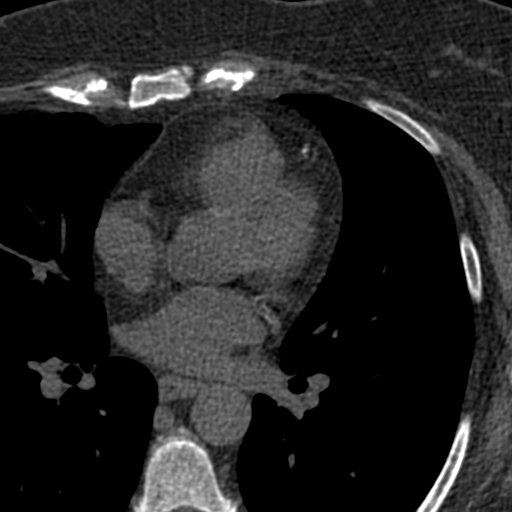
[im 56/70  vessel]
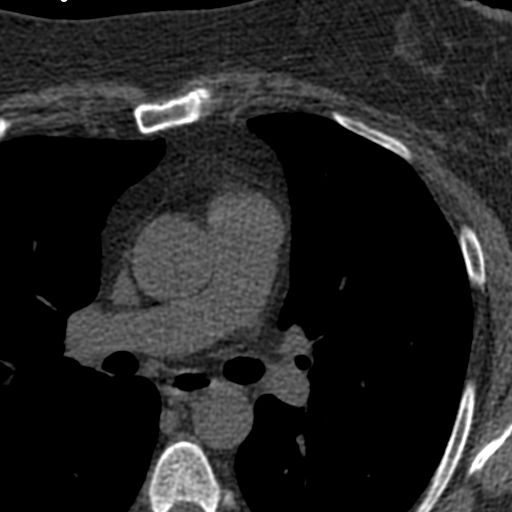

[Series 3: calcium scoring 2.00 br40 bestdiast 70% axial · axial · 0.67mm/px · z∈[+1583,+1675]mm · 5 of 70 slices shown, 7 images]
[im 12/70  vessel]
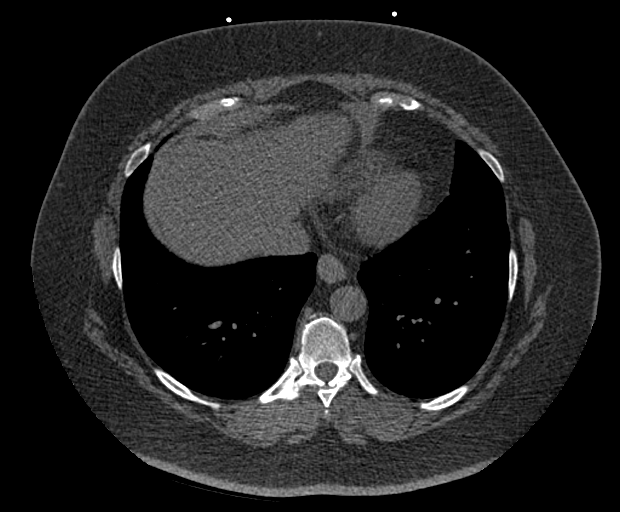
[im 12/70  lung]
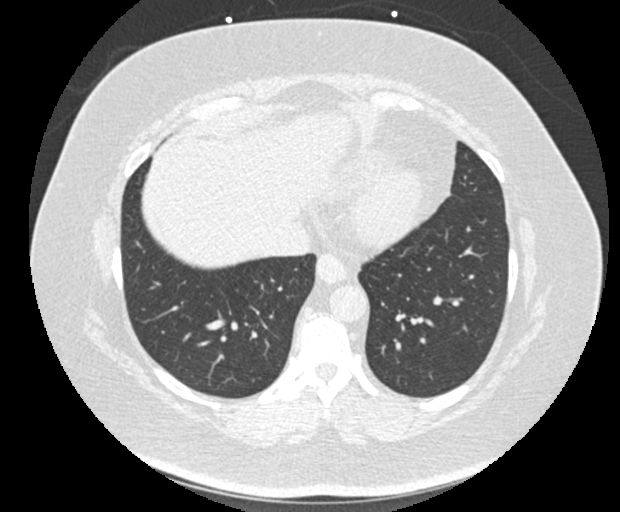
[im 24/70  vessel]
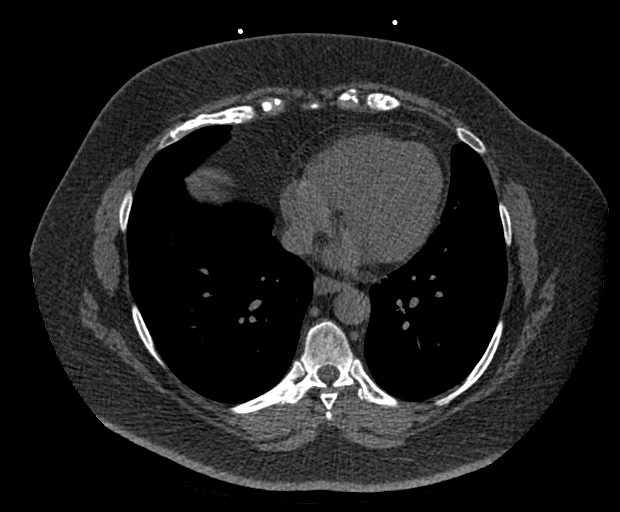
[im 35/70  vessel]
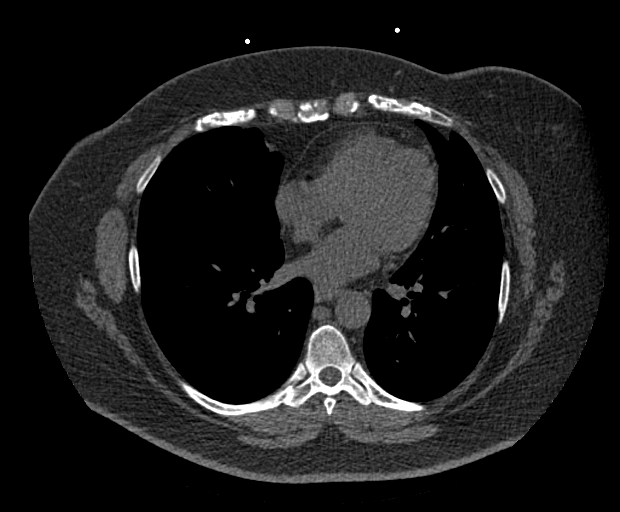
[im 47/70  vessel]
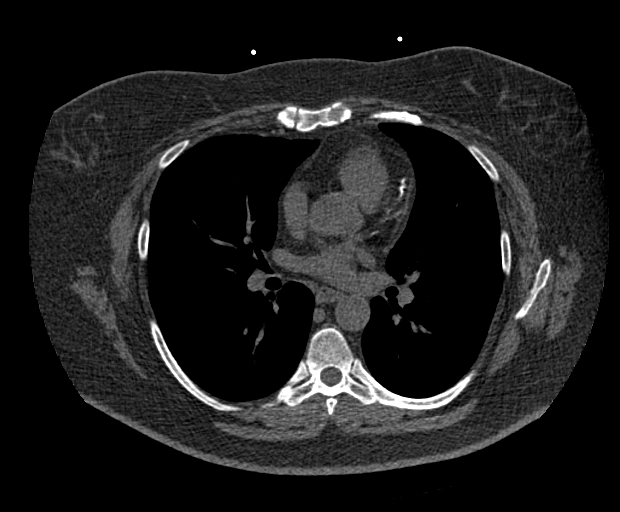
[im 58/70  vessel]
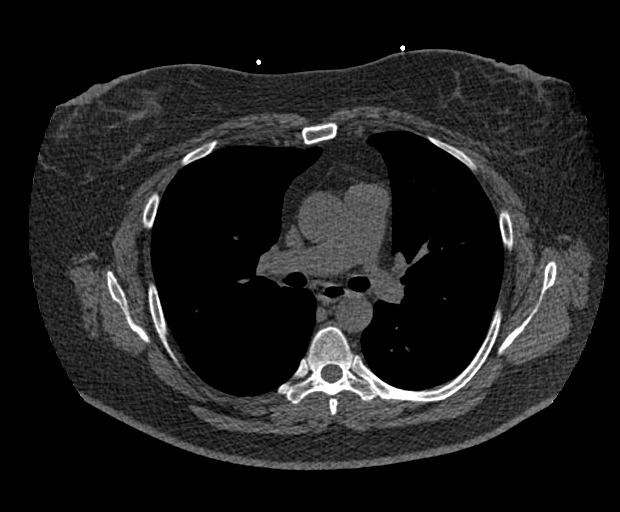
[im 58/70  lung]
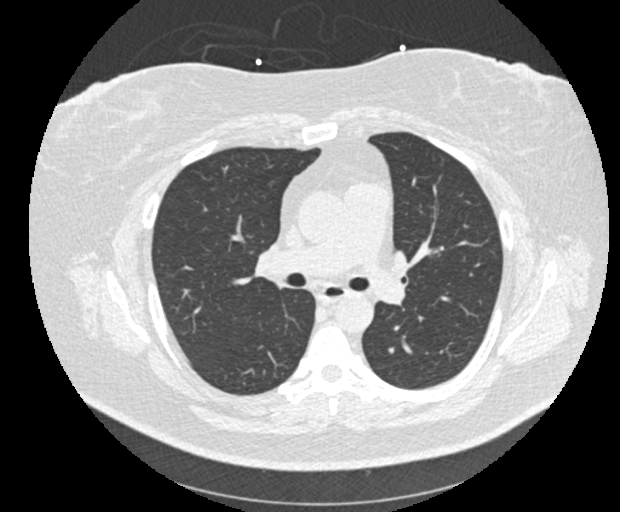

[Series 9: calcium scoring 2.00 br60 bestdiast 70% lungs · axial · 0.67mm/px · z∈[+1583,+1675]mm · 5 of 70 slices shown]
[im 12/70  vessel]
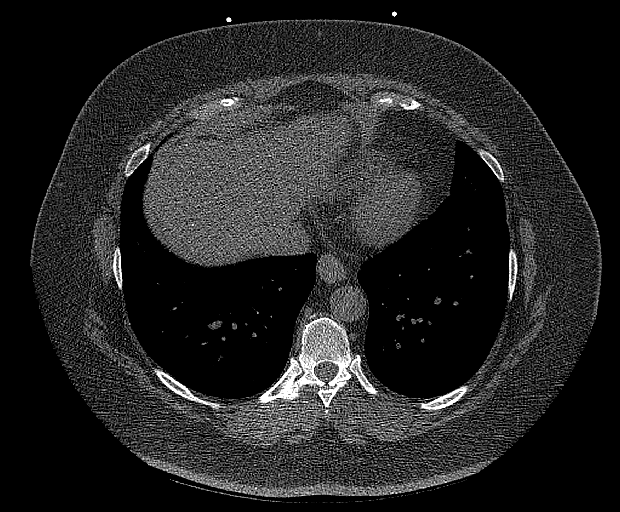
[im 24/70  vessel]
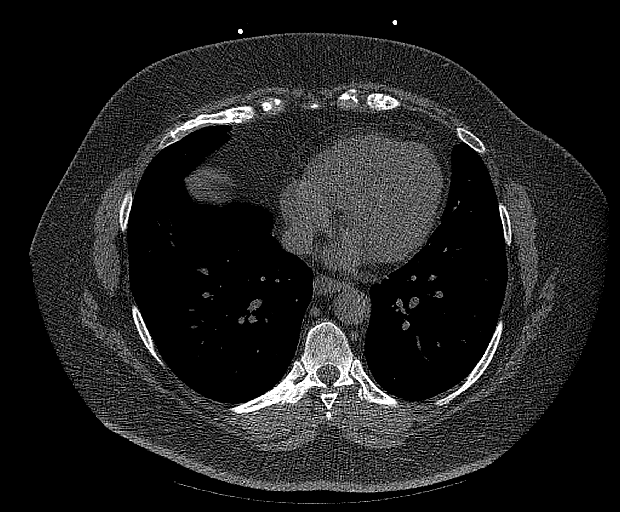
[im 35/70  vessel]
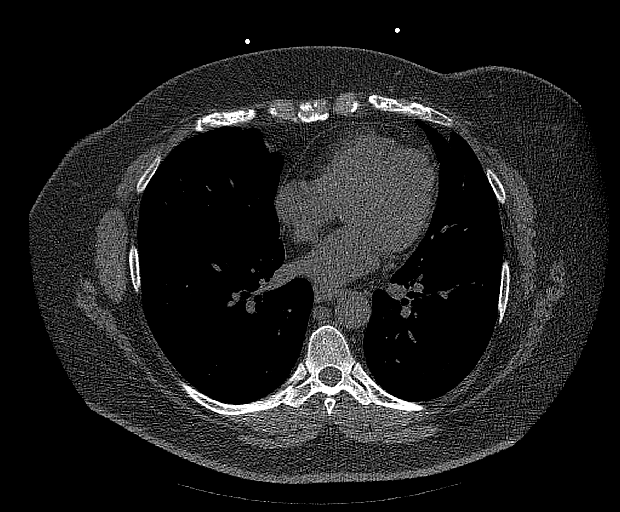
[im 47/70  vessel]
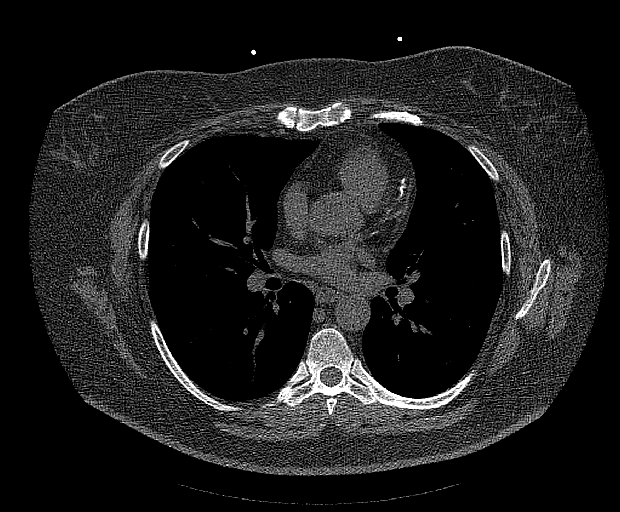
[im 58/70  vessel]
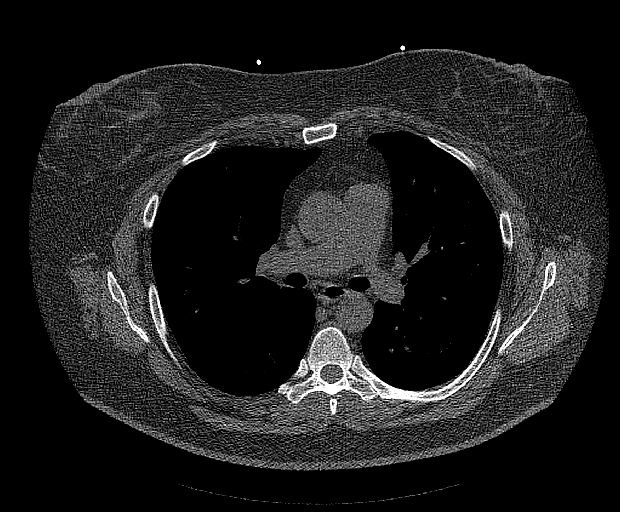

[14 of 20 positions shown; findings below may reference images not displayed]

FINDINGS: CORONARY CALCIUM SCORES:

Left Main: 0

LAD: 446

LCx:

RCA:

Total Agatston Score: 607

[HOSPITAL] percentile: 99th

AORTA MEASUREMENTS:

Ascending Aorta: 33 mm

Descending Aorta: 23 mm

OTHER FINDINGS:

Cardiovascular: The heart is normal in size.

Mediastinum/Nodes: Visualized esophagus and trachea within normal
limits. No mediastinal lymphadenopathy.

Lungs/Pleura: The lungs are clear bilaterally.

Upper Abdomen: The visualized upper abdomen is within normal limits.

Musculoskeletal: No acute osseous abnormality.
IMPRESSION: Severe three-vessel coronary atherosclerotic calcifications. The
observed calcium score of 607 is at the 99th percentile for subjects
of the same age, gender, and race/ethnicity who are free of clinical
cardiovascular disease and treated diabetes.

## 2023-02-11 ENCOUNTER — Ambulatory Visit: Payer: Managed Care, Other (non HMO) | Admitting: Cardiology

## 2023-02-27 ENCOUNTER — Other Ambulatory Visit: Payer: Self-pay | Admitting: Cardiology

## 2023-02-28 ENCOUNTER — Ambulatory Visit: Payer: 59 | Admitting: Cardiology

## 2023-02-28 ENCOUNTER — Encounter: Payer: Self-pay | Admitting: Cardiology

## 2023-02-28 VITALS — BP 132/78 | HR 87 | Resp 17 | Ht 65.0 in | Wt 209.0 lb

## 2023-02-28 DIAGNOSIS — K219 Gastro-esophageal reflux disease without esophagitis: Secondary | ICD-10-CM

## 2023-02-28 DIAGNOSIS — E6609 Other obesity due to excess calories: Secondary | ICD-10-CM

## 2023-02-28 DIAGNOSIS — R931 Abnormal findings on diagnostic imaging of heart and coronary circulation: Secondary | ICD-10-CM

## 2023-02-28 DIAGNOSIS — Z955 Presence of coronary angioplasty implant and graft: Secondary | ICD-10-CM

## 2023-02-28 DIAGNOSIS — I10 Essential (primary) hypertension: Secondary | ICD-10-CM

## 2023-02-28 DIAGNOSIS — E1169 Type 2 diabetes mellitus with other specified complication: Secondary | ICD-10-CM

## 2023-02-28 DIAGNOSIS — I2510 Atherosclerotic heart disease of native coronary artery without angina pectoris: Secondary | ICD-10-CM

## 2023-02-28 DIAGNOSIS — E1165 Type 2 diabetes mellitus with hyperglycemia: Secondary | ICD-10-CM

## 2023-02-28 MED ORDER — METOPROLOL SUCCINATE ER 25 MG PO TB24: ORAL_TABLET | ORAL | 1 refills | Status: DC

## 2023-02-28 MED ORDER — LISINOPRIL 10 MG PO TABS
10.0000 mg | ORAL_TABLET | Freq: Every day | ORAL | 1 refills | Status: AC
Start: 2023-02-28 — End: 2024-02-20

## 2023-02-28 MED ORDER — EZETIMIBE 10 MG PO TABS
10.0000 mg | ORAL_TABLET | Freq: Every day | ORAL | 1 refills | Status: DC
Start: 2023-02-28 — End: 2023-10-28

## 2023-02-28 MED ORDER — CLOPIDOGREL BISULFATE 75 MG PO TABS
75.0000 mg | ORAL_TABLET | Freq: Every day | ORAL | 1 refills | Status: AC
Start: 2023-02-28 — End: 2023-08-27

## 2023-02-28 MED ORDER — PANTOPRAZOLE SODIUM 40 MG PO TBEC
40.0000 mg | DELAYED_RELEASE_TABLET | Freq: Every day | ORAL | 0 refills | Status: DC
Start: 2023-02-28 — End: 2023-11-01

## 2023-02-28 NOTE — Progress Notes (Signed)
ID:  Leslie Hanna, DOB 08-26-1970, MRN [id number]  PCP:  Debroah Baller, FNP  Cardiologist:  Tessa Lerner, DO, [facility]  (established care 09/12/2021)  Date: 02/28/23 Last Office Visit: 11/29/2022  Chief Complaint  Patient presents with   Coronary Artery Disease   Follow-up    3 month   HPI  Leslie Hanna is a 52 y.o. Caucasian female whose past medical history and cardiovascular risk factors include: CAD s/p PCIx2 mid LAD (May 2024), Severe coronary artery calcification (total CAC 607, 99th percentile), non-insulin-dependent diabetes w/ neuropathy, hypertension, hyperlipidemia, former smoker, history of Barrett's esophagus/gastric ulcer, GERD.   Patient is being followed by the practice for underlying CAD with prior coronary interventions.  Patient presents today for 94-month follow-up visit.  Denies any anginal chest pain or heart failure symptoms.  Has lost approximately 6 pounds in the last office visit.  Unfortunately, her husband recently had a stroke and she has been helping him out on a regular basis.  Outside labs independently reviewed in Care Everywhere.  FUNCTIONAL STATUS: No structured exercise program or daily routine.   ALLERGIES: No Known Allergies  MEDICATION LIST PRIOR TO VISIT: Current Meds  Medication Sig   ASPIRIN LOW DOSE 81 MG tablet SWALLOW ONE WHOLE TABLET BY MOUTH DAILY AS DIRECTED BY DOCTOR   dapagliflozin propanediol (FARXIGA) 10 MG TABS tablet Take 10 mg by mouth daily.   DULoxetine (CYMBALTA) 60 MG capsule Take 60 mg by mouth daily.   medroxyPROGESTERone (DEPO-PROVERA) 150 MG/ML injection Inject 150 mg into the muscle every 3 (three) months.   metFORMIN (GLUCOPHAGE-XR) 500 MG 24 hr tablet Take 1,000 mg by mouth daily.   nitroGLYCERIN (NITROSTAT) 0.4 MG SL tablet Place 1 tablet (0.4 mg total) under the tongue every 5 (five) minutes as needed for up to 25 days for chest pain.   rosuvastatin (CRESTOR) 20 MG tablet Take 1 tablet (20 mg total)  by mouth at bedtime.   tirzepatide [facility]) 7.5 MG/0.5ML Pen Inject 7.5 mg into the skin every Thursday.   [DISCONTINUED] clopidogrel (PLAVIX) 75 MG tablet TAKE 1 TABLET BY MOUTH DAILY WITH BREAKFAST   [DISCONTINUED] ezetimibe (ZETIA) 10 MG tablet TAKE 1 TABLET BY MOUTH DAILY   [DISCONTINUED] lisinopril (ZESTRIL) 10 MG tablet Take 10 mg by mouth daily.   [DISCONTINUED] metoprolol succinate (TOPROL-XL) 25 MG 24 hr tablet TAKE 1 TABLET BY MOUTH DAILY WITH OR IMMEDIATELY FOLLOWING A MEAL   [DISCONTINUED] pantoprazole (PROTONIX) 40 MG tablet TAKE 1 TABLET BY MOUTH DAILY BEFORE BREAKFAST     PAST MEDICAL HISTORY: Past Medical History:  Diagnosis Date   Barrett esophagus    Diabetes mellitus without complication (HCC)    Hyperlipidemia    Hypertension    Post-operative nausea and vomiting    post op headache    PAST SURGICAL HISTORY: Past Surgical History:  Procedure Laterality Date   CORONARY ATHERECTOMY N/A 11/13/2022   Procedure: CORONARY ATHERECTOMY;  Surgeon: Elder Negus, MD;  Location: [facility] INVASIVE CV LAB;  Service: Cardiovascular;  Laterality: N/A;   CORONARY PRESSURE/FFR STUDY N/A 11/13/2022   Procedure: CORONARY PRESSURE/FFR STUDY;  Surgeon: Elder Negus, MD;  Location: [facility] INVASIVE CV LAB;  Service: Cardiovascular;  Laterality: N/A;   CORONARY STENT INTERVENTION N/A 11/13/2022   Procedure: CORONARY STENT INTERVENTION;  Surgeon: Elder Negus, MD;  Location: [facility] INVASIVE CV LAB;  Service: Cardiovascular;  Laterality: N/A;   CORONARY ULTRASOUND/IVUS N/A 11/13/2022   Procedure: Coronary Ultrasound/IVUS;  Surgeon: Elder Negus, MD;  Location: [facility]  INVASIVE CV LAB;  Service: Cardiovascular;  Laterality: N/A;   LEFT HEART CATH AND CORONARY ANGIOGRAPHY N/A 11/13/2022   Procedure: LEFT HEART CATH AND CORONARY ANGIOGRAPHY;  Surgeon: Elder Negus, MD;  Location: [facility] INVASIVE CV LAB;  Service: Cardiovascular;  Laterality: N/A;   UPPER GASTROINTESTINAL  ENDOSCOPY     10 years ago -[place], [place]    FAMILY HISTORY: The patient family history includes Alzheimer's disease in her father and maternal grandmother; Asthma in her sister; Cancer in her maternal grandfather, paternal aunt, paternal aunt, and paternal aunt; Colon cancer (age of onset: 3) in her sister; Colon polyps in her sister; Diabetes in her father; Emphysema in her paternal grandfather; Heart attack in her maternal grandmother; Hyperlipidemia in her mother; Hypertension in her mother; Pneumonia in her daughter; Stroke in her maternal grandmother; Thyroid disease in her mother.  SOCIAL HISTORY:  The patient  reports that she quit smoking about 9 years ago. Her smoking use included cigarettes. She started smoking about 31 years ago. She has a 33 pack-year smoking history. She has never used smokeless tobacco. She reports that she does not drink alcohol and does not use drugs.  REVIEW OF SYSTEMS: Review of Systems  Cardiovascular:  Negative for chest pain, claudication, dyspnea on exertion, irregular heartbeat, leg swelling, near-syncope, orthopnea, palpitations, paroxysmal nocturnal dyspnea and syncope.  Respiratory:  Negative for shortness of breath.   Hematologic/Lymphatic: Negative for bleeding problem.  Musculoskeletal:  Negative for muscle cramps and myalgias.  Gastrointestinal:  Negative for heartburn.  Neurological:  Negative for dizziness and light-headedness.    PHYSICAL EXAM:    02/28/2023   10:13 AM 11/29/2022   10:32 AM 11/13/2022    4:45 PM  Vitals with BMI  Height 5\' 5"  5\' 5"    Weight 209 lbs 215 lbs   BMI 34.78 35.78   Systolic 132 104 811  Diastolic 78 71 81  Pulse 87 81 80   Physical Exam  Constitutional: No distress.  Age appropriate, hemodynamically stable.   Neck: No JVD present.  Cardiovascular: Normal rate, regular rhythm, S1 normal, S2 normal, intact distal pulses and normal pulses. Exam reveals no gallop, no S3 and no S4.  No murmur  heard. Pulmonary/Chest: Effort normal and breath sounds normal. No stridor. She has no wheezes. She has no rales.  Abdominal: Soft. Bowel sounds are normal. She exhibits no distension. There is no abdominal tenderness.  Musculoskeletal:        General: No edema.     Cervical back: Neck supple.  Neurological: She is alert and oriented to person, place, and time. She has intact cranial nerves (2-12).  Skin: Skin is warm and moist.   RADIOLOGY:  Chest Xray 05/01/2022: FINDINGS: The heart and mediastinal contours are within normal limits. Slightly prominent right hilar vasculature likely due to patient rotation.   No focal consolidation. No pulmonary edema. No pleural effusion. No pneumothorax.   No acute osseous abnormality.   IMPRESSION: No active cardiopulmonary disease.  CARDIAC DATABASE: EKG November 29, 2022: Sinus rhythm, 81 bpm, poor R wave progression, without underlying injury pattern.  Echocardiogram: 09/26/2021:  Left ventricle cavity is normal in size and wall thickness. Normal global wall motion. Normal LV systolic function with EF 59%. Normal diastolic filling pattern. No significant valvular abnormality.  Normal right atrial pressure.   Stress Testing: Exercise nuclear stress test 11/01/2021: The patient exercised for 3 minutes and 1 seconds of a Bruce protocol, achieving approximately 4.64 METs and 88% of age predicted maximum  heart rate. Exercise capacity was low. No chest pain reported. Accelerated heart rate. Normal hemodynamic response. Stress EKG revealed no ischemic changes. There is a very small sized moderate reversible defect in the apical region. This may also represent mild breast attenuation artifact.  Overall LV systolic function is normal without regional wall motion abnormalities. Stress LV EF: 61%.  No previous exam available for comparison. Low risk.  Heart Catheterization: 11/13/2022 LM: Ostial 20% stenosis LAD: Prox mild disease         Mid  calcific 80% stenosis, followed by 70% stenosis         Distal 50% disease         Pre PCI iFR 0.79, with biggest step up in calcific section of mid LAD Lcx: Ostial 20%, prox 50% stenosis. iFR 0.95 (non-significant) RCA: Mild luminal irregularities       Successful percutaneous coronary intervention mid LAD     Orbital atherectomy, PTCA and overlapping stents placement     PTCA and stents placement Synergy DES 3.5 X 24 mm and 3.0 X 24 mm drug-eluting stents     Post dilatation with 3.75 and 3.25 mm [place] balloons up to 18 atm   Post atherectomy IVUS MSA 3.7 mm2 Post PCI IVUS MSA >8 mm2   RFR LAD: Pre PCI: 0.79 Post PCI 1st stent: 0.83 Post PCI 2nd stent: 0.91   LABORATORY DATA:     Latest Ref Rng & Units 11/09/2022    8:48 AM 05/01/2022    9:01 PM 02/07/2022    9:22 AM  BMP  Glucose 70 - 99 mg/dL 161  096  045   BUN 6 - 24 mg/dL 14  16  13    Creatinine 0.57 - 1.00 mg/dL 4.09  8.11  9.14   BUN/Creat Ratio 9 - 23 16   15    Sodium 134 - 144 mmol/L 140  139  137   Potassium 3.5 - 5.2 mmol/L 5.0  4.4  4.7   Chloride 96 - 106 mmol/L 103  104  98   CO2 20 - 29 mmol/L 22  24  19    Calcium 8.7 - 10.2 mg/dL 9.8  9.9  9.5    CBC Lab Results  Component Value Date   WBC 10.0 11/09/2022   HGB 14.7 11/09/2022   HCT 47.7 (H) 11/09/2022   MCV 88 11/09/2022   PLT 345 11/09/2022    Lipid Panel     Component Value Date/Time   CHOL 96 (L) 10/31/2022 0850   TRIG 98 10/31/2022 0850   HDL 42 10/31/2022 0850   LDLCALC 35 10/31/2022 0850   LDLDIRECT 49 02/07/2022 0921   LABVLDL 19 10/31/2022 0850   No results found for: "HGBA1C"  External Labs:  Date Collected: 08/31/2021 , information obtained by referring physician Potassium: 4.3 Creatinine 0.83 mg/dL. eGFR: 86 mL/min per 1.73 m Hemoglobin: 15.3 g/dL and hematocrit: 78.2 % AST: 19 , ALT: 24 , alkaline phosphatase: 129  Hemoglobin A1c: 10.8 TSH: 1.75    External Labs: Office note 02/04/2023 available in Care Everywhere. BUN  17, creatinine 0.91. AST and ALT within normal limits. Total cholesterol 114, triglycerides 105, HDL 46, LDL calculated 49. Hemoglobin 15.3 g/dL. Hemoglobin A1c 8.7  IMPRESSION:    ICD-10-CM   1. Atherosclerosis of native coronary artery of native heart without angina pectoris  I25.10 ezetimibe (ZETIA) 10 MG tablet    metoprolol succinate (TOPROL-XL) 25 MG 24 hr tablet    clopidogrel (PLAVIX) 75 MG tablet  2. S/P coronary artery stent placement  Z95.5 ezetimibe (ZETIA) 10 MG tablet    metoprolol succinate (TOPROL-XL) 25 MG 24 hr tablet    clopidogrel (PLAVIX) 75 MG tablet    3. Coronary atherosclerosis due to calcified coronary lesion  I25.10 ezetimibe (ZETIA) 10 MG tablet   I25.84 metoprolol succinate (TOPROL-XL) 25 MG 24 hr tablet    clopidogrel (PLAVIX) 75 MG tablet    4. Agatston CAC score, >400  R93.1 ezetimibe (ZETIA) 10 MG tablet    metoprolol succinate (TOPROL-XL) 25 MG 24 hr tablet    clopidogrel (PLAVIX) 75 MG tablet    5. Benign hypertension  I10 lisinopril (ZESTRIL) 10 MG tablet    6. Type 2 diabetes mellitus with hyperglycemia, without long-term current use of insulin (HCC)  E11.65     7. Type 2 diabetes mellitus with hyperlipidemia (HCC)  E11.69    E78.5     8. Class 1 obesity due to excess calories with serious comorbidity and body mass index (BMI) of 34.0 to 34.9 in adult  E66.09    Z68.34     9. Gastroesophageal reflux disease, unspecified whether esophagitis present  K21.9 pantoprazole (PROTONIX) 40 MG tablet       RECOMMENDATIONS: Leslie Hanna is a 52 y.o. Caucasian female whose past medical history and cardiac risk factors include: Non-insulin-dependent diabetes w/ neuropathy, hypertension, hyperlipidemia, former smoker.   Atherosclerosis of native coronary artery of native heart without angina pectoris S/P coronary artery stent placement Coronary atherosclerosis due to calcified coronary lesion Agatston CAC score, >400 Denies anginal chest  pain. No use of sublingual nitroglycerin tablets since the last office visit. Prior EKG nonischemic. Status post PCI to the mid LAD x 2. Currently on dual antiplatelet therapy. Medications refilled. Outside labs independently reviewed-LDL at goal. Continues to lose weight-congratulated for weight loss urinary  Benign hypertension Office blood pressures are well-controlled. Medications reconciled. Refills provided  Type 2 diabetes mellitus with hyperglycemia, without long-term current use of insulin (HCC) Type 2 diabetes mellitus with hyperlipidemia (HCC) Patient is educated on the importance of glycemic control in setting of CAD status post stents. LDL currently at goal as of August 2024.  Outside labs independently reviewed. Medications refilled.  Currently on lisinopril, Zetia, statin therapy, Mounjaro  Patient requesting refills on noncardiac medications as she is in the search of a new PCP.  Refilled Protonix for now.  FINAL MEDICATION LIST END OF ENCOUNTER: Meds ordered this encounter  Medications   ezetimibe (ZETIA) 10 MG tablet    Sig: Take 1 tablet (10 mg total) by mouth daily.    Dispense:  90 tablet    Refill:  1   lisinopril (ZESTRIL) 10 MG tablet    Sig: Take 1 tablet (10 mg total) by mouth daily.    Dispense:  90 tablet    Refill:  1   metoprolol succinate (TOPROL-XL) 25 MG 24 hr tablet    Sig: TAKE 1 TABLET BY MOUTH DAILY WITH OR IMMEDIATELY FOLLOWING A MEAL    Dispense:  90 tablet    Refill:  1   clopidogrel (PLAVIX) 75 MG tablet    Sig: Take 1 tablet (75 mg total) by mouth daily with breakfast.    Dispense:  90 tablet    Refill:  1   pantoprazole (PROTONIX) 40 MG tablet    Sig: Take 1 tablet (40 mg total) by mouth daily before breakfast.    Dispense:  90 tablet    Refill:  0    Medications  Discontinued During This Encounter  Medication Reason   lisinopril (ZESTRIL) 10 MG tablet Reorder   ezetimibe (ZETIA) 10 MG tablet Reorder   metoprolol succinate  (TOPROL-XL) 25 MG 24 hr tablet Reorder   clopidogrel (PLAVIX) 75 MG tablet Reorder   pantoprazole (PROTONIX) 40 MG tablet Reorder     Current Outpatient Medications:    ASPIRIN LOW DOSE 81 MG tablet, SWALLOW ONE WHOLE TABLET BY MOUTH DAILY AS DIRECTED BY DOCTOR, Disp: 30 tablet, Rfl: 12   dapagliflozin propanediol (FARXIGA) 10 MG TABS tablet, Take 10 mg by mouth daily., Disp: , Rfl:    DULoxetine (CYMBALTA) 60 MG capsule, Take 60 mg by mouth daily., Disp: , Rfl:    medroxyPROGESTERone (DEPO-PROVERA) 150 MG/ML injection, Inject 150 mg into the muscle every 3 (three) months., Disp: , Rfl:    metFORMIN (GLUCOPHAGE-XR) 500 MG 24 hr tablet, Take 1,000 mg by mouth daily., Disp: , Rfl:    nitroGLYCERIN (NITROSTAT) 0.4 MG SL tablet, Place 1 tablet (0.4 mg total) under the tongue every 5 (five) minutes as needed for up to 25 days for chest pain., Disp: 25 tablet, Rfl: 3   rosuvastatin (CRESTOR) 20 MG tablet, Take 1 tablet (20 mg total) by mouth at bedtime., Disp: 90 tablet, Rfl: 3   tirzepatide (MOUNJARO) 7.5 MG/0.5ML Pen, Inject 7.5 mg into the skin every Thursday., Disp: , Rfl:    clopidogrel (PLAVIX) 75 MG tablet, Take 1 tablet (75 mg total) by mouth daily with breakfast., Disp: 90 tablet, Rfl: 1   ezetimibe (ZETIA) 10 MG tablet, Take 1 tablet (10 mg total) by mouth daily., Disp: 90 tablet, Rfl: 1   lisinopril (ZESTRIL) 10 MG tablet, Take 1 tablet (10 mg total) by mouth daily., Disp: 90 tablet, Rfl: 1   metoprolol succinate (TOPROL-XL) 25 MG 24 hr tablet, TAKE 1 TABLET BY MOUTH DAILY WITH OR IMMEDIATELY FOLLOWING A MEAL, Disp: 90 tablet, Rfl: 1   pantoprazole (PROTONIX) 40 MG tablet, Take 1 tablet (40 mg total) by mouth daily before breakfast., Disp: 90 tablet, Rfl: 0  No orders of the defined types were placed in this encounter.   There are no Patient Instructions on file for this visit.   --Continue cardiac medications as reconciled in final medication list. --Return in about 6 months (around  08/28/2023) for Follow up, CAD. Or sooner if needed. --Continue follow-up with your primary care physician regarding the management of your other chronic comorbid conditions.  Patient's questions and concerns were addressed to her satisfaction. She voices understanding of the instructions provided during this encounter.   This note was created using a voice recognition software as a result there may be grammatical errors inadvertently enclosed that do not reflect the nature of this encounter. Every attempt is made to correct such errors.  Tessa Lerner, [place], [facility]  Pager:  [phone] Office: [phone]

## 2023-04-30 ENCOUNTER — Other Ambulatory Visit: Payer: Self-pay | Admitting: Cardiology

## 2023-04-30 DIAGNOSIS — Z955 Presence of coronary angioplasty implant and graft: Secondary | ICD-10-CM

## 2023-04-30 DIAGNOSIS — I2510 Atherosclerotic heart disease of native coronary artery without angina pectoris: Secondary | ICD-10-CM

## 2023-04-30 DIAGNOSIS — R931 Abnormal findings on diagnostic imaging of heart and coronary circulation: Secondary | ICD-10-CM

## 2023-09-29 ENCOUNTER — Other Ambulatory Visit: Payer: Self-pay | Admitting: Cardiology

## 2023-09-29 DIAGNOSIS — I2510 Atherosclerotic heart disease of native coronary artery without angina pectoris: Secondary | ICD-10-CM

## 2023-09-29 DIAGNOSIS — R931 Abnormal findings on diagnostic imaging of heart and coronary circulation: Secondary | ICD-10-CM

## 2023-09-29 DIAGNOSIS — E1169 Type 2 diabetes mellitus with other specified complication: Secondary | ICD-10-CM

## 2023-10-28 ENCOUNTER — Other Ambulatory Visit: Payer: Self-pay | Admitting: Cardiology

## 2023-10-28 DIAGNOSIS — I2510 Atherosclerotic heart disease of native coronary artery without angina pectoris: Secondary | ICD-10-CM

## 2023-10-28 DIAGNOSIS — Z955 Presence of coronary angioplasty implant and graft: Secondary | ICD-10-CM

## 2023-10-28 DIAGNOSIS — R931 Abnormal findings on diagnostic imaging of heart and coronary circulation: Secondary | ICD-10-CM

## 2023-11-01 ENCOUNTER — Other Ambulatory Visit: Payer: Self-pay

## 2023-11-01 DIAGNOSIS — K219 Gastro-esophageal reflux disease without esophagitis: Secondary | ICD-10-CM

## 2023-11-01 MED ORDER — PANTOPRAZOLE SODIUM 40 MG PO TBEC
40.0000 mg | DELAYED_RELEASE_TABLET | Freq: Every day | ORAL | 1 refills | Status: DC
Start: 2023-11-01 — End: 2024-05-11

## 2023-11-27 ENCOUNTER — Other Ambulatory Visit: Payer: Self-pay | Admitting: Cardiology

## 2023-11-27 DIAGNOSIS — R931 Abnormal findings on diagnostic imaging of heart and coronary circulation: Secondary | ICD-10-CM

## 2023-11-27 DIAGNOSIS — Z955 Presence of coronary angioplasty implant and graft: Secondary | ICD-10-CM

## 2023-11-27 DIAGNOSIS — I2510 Atherosclerotic heart disease of native coronary artery without angina pectoris: Secondary | ICD-10-CM

## 2023-11-29 ENCOUNTER — Other Ambulatory Visit: Payer: Self-pay | Admitting: Cardiology

## 2023-11-29 DIAGNOSIS — Z955 Presence of coronary angioplasty implant and graft: Secondary | ICD-10-CM

## 2023-11-29 DIAGNOSIS — R931 Abnormal findings on diagnostic imaging of heart and coronary circulation: Secondary | ICD-10-CM

## 2023-11-29 DIAGNOSIS — I2510 Atherosclerotic heart disease of native coronary artery without angina pectoris: Secondary | ICD-10-CM

## 2023-12-28 ENCOUNTER — Other Ambulatory Visit: Payer: Self-pay | Admitting: Cardiology

## 2023-12-28 DIAGNOSIS — R931 Abnormal findings on diagnostic imaging of heart and coronary circulation: Secondary | ICD-10-CM

## 2023-12-28 DIAGNOSIS — I2510 Atherosclerotic heart disease of native coronary artery without angina pectoris: Secondary | ICD-10-CM

## 2023-12-28 DIAGNOSIS — Z955 Presence of coronary angioplasty implant and graft: Secondary | ICD-10-CM

## 2024-01-13 ENCOUNTER — Other Ambulatory Visit: Payer: Self-pay | Admitting: Cardiology

## 2024-01-13 DIAGNOSIS — R931 Abnormal findings on diagnostic imaging of heart and coronary circulation: Secondary | ICD-10-CM

## 2024-01-13 DIAGNOSIS — Z955 Presence of coronary angioplasty implant and graft: Secondary | ICD-10-CM

## 2024-01-13 DIAGNOSIS — I2510 Atherosclerotic heart disease of native coronary artery without angina pectoris: Secondary | ICD-10-CM

## 2024-01-31 ENCOUNTER — Emergency Department (HOSPITAL_COMMUNITY)

## 2024-01-31 ENCOUNTER — Encounter (HOSPITAL_COMMUNITY): Payer: Self-pay | Admitting: Emergency Medicine

## 2024-01-31 ENCOUNTER — Emergency Department (HOSPITAL_COMMUNITY)
Admission: EM | Admit: 2024-01-31 | Discharge: 2024-01-31 | Disposition: A | Discharge status: Home / Self Care | Attending: Emergency Medicine | Admitting: Emergency Medicine

## 2024-01-31 ENCOUNTER — Encounter: Payer: Self-pay | Admitting: Cardiology

## 2024-01-31 ENCOUNTER — Other Ambulatory Visit: Payer: Self-pay

## 2024-01-31 ENCOUNTER — Ambulatory Visit: Admitting: Cardiology

## 2024-01-31 DIAGNOSIS — Z7984 Long term (current) use of oral hypoglycemic drugs: Secondary | ICD-10-CM | POA: Insufficient documentation

## 2024-01-31 DIAGNOSIS — Z7982 Long term (current) use of aspirin: Secondary | ICD-10-CM | POA: Diagnosis not present

## 2024-01-31 DIAGNOSIS — I2510 Atherosclerotic heart disease of native coronary artery without angina pectoris: Secondary | ICD-10-CM | POA: Diagnosis not present

## 2024-01-31 DIAGNOSIS — I10 Essential (primary) hypertension: Secondary | ICD-10-CM | POA: Diagnosis not present

## 2024-01-31 DIAGNOSIS — D72829 Elevated white blood cell count, unspecified: Secondary | ICD-10-CM | POA: Insufficient documentation

## 2024-01-31 DIAGNOSIS — R072 Precordial pain: Secondary | ICD-10-CM | POA: Diagnosis present

## 2024-01-31 DIAGNOSIS — R079 Chest pain, unspecified: Secondary | ICD-10-CM

## 2024-01-31 DIAGNOSIS — Z79899 Other long term (current) drug therapy: Secondary | ICD-10-CM | POA: Diagnosis not present

## 2024-01-31 DIAGNOSIS — E119 Type 2 diabetes mellitus without complications: Secondary | ICD-10-CM | POA: Insufficient documentation

## 2024-01-31 LAB — TROPONIN I (HIGH SENSITIVITY)
Troponin I (High Sensitivity): 4 ng/L (ref <18)
Troponin I (High Sensitivity): 4 ng/L (ref <18)

## 2024-01-31 LAB — D-DIMER, QUANTITATIVE: D-Dimer, Quant: <0.27 ug{FEU}/mL (ref 0.00–0.50)

## 2024-01-31 LAB — CBC
HCT: 43.6 % (ref 36.0–46.0)
Hemoglobin: 14.6 g/dL (ref 12.0–15.0)
MCH: 29.7 pg (ref 26.0–34.0)
MCHC: 33.5 g/dL (ref 30.0–36.0)
MCV: 88.6 fL (ref 80.0–100.0)
Platelets: 295 K/uL (ref 150–400)
RBC: 4.92 MIL/uL (ref 3.87–5.11)
RDW: 13.3 % (ref 11.5–15.5)
WBC: 15 K/uL — ABNORMAL HIGH (ref 4.0–10.5)
nRBC: 0 % (ref 0.0–0.2)

## 2024-01-31 LAB — BASIC METABOLIC PANEL WITH GFR
Anion gap: 9 (ref 5–15)
BUN: 16 mg/dL (ref 6–20)
CO2: 24 mmol/L (ref 22–32)
Calcium: 9 mg/dL (ref 8.9–10.3)
Chloride: 104 mmol/L (ref 98–111)
Creatinine, Ser: 1.19 mg/dL — ABNORMAL HIGH (ref 0.44–1.00)
GFR, Estimated: 55 mL/min — ABNORMAL LOW (ref >60)
Glucose, Bld: 119 mg/dL — ABNORMAL HIGH (ref 70–99)
Potassium: 4 mmol/L (ref 3.5–5.1)
Sodium: 137 mmol/L (ref 135–145)

## 2024-01-31 LAB — C-REACTIVE PROTEIN: CRP: 0.6 mg/dL (ref <1.0)

## 2024-01-31 LAB — SEDIMENTATION RATE: Sed Rate: 9 mm/h (ref 0–22)

## 2024-01-31 MED ORDER — ALUM & MAG HYDROXIDE-SIMETH 200-200-20 MG/5ML PO SUSP
30.0000 mL | Freq: Once | ORAL | Status: AC
  Administered 2024-01-31: 30 mL via ORAL
  Filled 2024-01-31: qty 30

## 2024-01-31 MED ORDER — KETOROLAC TROMETHAMINE 15 MG/ML IJ SOLN
15.0000 mg | Freq: Once | INTRAMUSCULAR | Status: AC
  Administered 2024-01-31: 15 mg via INTRAVENOUS
  Filled 2024-01-31: qty 1

## 2024-01-31 MED ORDER — LIDOCAINE VISCOUS HCL 2 % MT SOLN
15.0000 mL | Freq: Once | OROMUCOSAL | Status: AC
  Administered 2024-01-31: 15 mL via ORAL
  Filled 2024-01-31: qty 15

## 2024-01-31 NOTE — ED Triage Notes (Signed)
 Pt in from home via [facility] with substernal cp that woke her up at 0130 this morning. Pain is stabbing, worse with deep breaths. Hx of STEMI, hx 2 stents placed. Pt took 324mg  ASA and 1 NTG while at home with no reported pain relief. Arrives with 7/10 pain  VS w/EMS: 123/78 80HR 100%RA CBG 115 18G LFA  4mg  Zofran  PTA

## 2024-01-31 NOTE — ED Provider Notes (Signed)
 [facility] AT [facility] Provider Note  CSN: [id number] Arrival date & time: 01/31/24 0458  Chief Complaint(s) Chest Pain  HPI Leslie Hanna is a 53 y.o. female with a past medical history listed below including CAD with prior stenting here for substernal chest pain described as stabbing pain that began around 1:30 AM this morning while patient was asleep.  Pain is worse with deep breathing.  She did report lower jaw pain associated with the chest pain which was also pleuritic in nature.  Patient tried taking nitroglycerin  without relief.  She called EMS and was instructed to take aspirin  which she did.  Pain is nonexertional.  No associated shortness of breath.  Patient reports a history of pleurisy but reports she has not been sick recently.  No recent fevers or infections.  No cough or congestion.  No vomiting.  No abdominal pain.  No diarrhea.  Patient denies any prior history of DVT/PE.  She denies any prolonged immobilization or recent travel.  No OCP use.  No autoimmune disorder.  The history is provided by the patient.  Chest Pain   Past Medical History Past Medical History:  Diagnosis Date   Barrett esophagus    Diabetes mellitus without complication (HCC)    Hyperlipidemia    Hypertension    Post-operative nausea and vomiting    post op headache   Patient Active Problem List   Diagnosis Date Noted   Coronary artery disease of native artery of native heart with stable angina pectoris (HCC) 11/13/2022   Leukocytosis 12/31/2019   Home Medication(s) Prior to Admission medications   Medication Sig Start Date End Date Taking? Authorizing Provider  ASPIRIN  LOW DOSE 81 MG tablet SWALLOW ONE WHOLE TABLET BY MOUTH DAILY AS DIRECTED BY DOCTOR 11/16/22   Tolia, Sunit, DO  clopidogrel  (PLAVIX ) 75 MG tablet TAKE 1 TABLET BY MOUTH DAILY WITH BREAKFAST 11/29/23   Tolia, Sunit, DO  dapagliflozin propanediol (FARXIGA) 10 MG TABS tablet Take 10 mg by  mouth daily.    [provider]  DULoxetine (CYMBALTA) 60 MG capsule Take 60 mg by mouth daily.    [provider]  ezetimibe  (ZETIA ) 10 MG tablet TAKE 1 TABLET BY MOUTH DAILY 10/28/23   Tolia, Sunit, DO  lisinopril  (ZESTRIL ) 10 MG tablet Take 1 tablet (10 mg total) by mouth daily. 02/28/23 08/27/23  Tolia, Sunit, DO  medroxyPROGESTERone (DEPO-PROVERA) 150 MG/ML injection Inject 150 mg into the muscle every 3 (three) months.    [provider]  metFORMIN (GLUCOPHAGE-XR) 500 MG 24 hr tablet Take 1,000 mg by mouth daily.    [provider]  metoprolol  succinate (TOPROL -XL) 25 MG 24 hr tablet TAKE 1 TABLET BY MOUTH DAILY WITH OR IMMEDIATELY FOLLOWING A MEAL; **MUST KEEP APPOINTMENT FOR FUTURE REFILLS 01/14/24   Tolia, Sunit, DO  nitroGLYCERIN  (NITROSTAT ) 0.4 MG SL tablet Place 1 tablet (0.4 mg total) under the tongue every 5 (five) minutes as needed for up to 25 days for chest pain. 05/02/22 02/28/23  Osa Grate, NP  pantoprazole  (PROTONIX ) 40 MG tablet Take 1 tablet (40 mg total) by mouth daily before breakfast. 11/01/23   Tolia, Sunit, DO  rosuvastatin  (CRESTOR ) 20 MG tablet TAKE 1 TABLET BY MOUTH AT BEDTIME 10/01/23   Tolia, Sunit, DO  tirzepatide (MOUNJARO) 7.5 MG/0.5ML Pen Inject 7.5 mg into the skin every Thursday.    [provider]  Allergies Patient has no known allergies.  Review of Systems Review of Systems  Cardiovascular:  Positive for chest pain.   As noted in HPI  Physical Exam Vital Signs  I have reviewed the triage vital signs BP 118/85   Pulse 81   Temp 98.6 F (37 C) (Oral)   Resp 18   Wt 94.8 kg   LMP  (Exact Date)   SpO2 99%   BMI 34.78 kg/m   Physical Exam Vitals reviewed.  Constitutional:      General: She is not in acute distress.    Appearance: She is well-developed. She is not  diaphoretic.  HENT:     Head: Normocephalic and atraumatic.     Nose: Nose normal.  Eyes:     General: No scleral icterus.       Right eye: No discharge.        Left eye: No discharge.     Conjunctiva/sclera: Conjunctivae normal.     Pupils: Pupils are equal, round, and reactive to light.  Cardiovascular:     Rate and Rhythm: Normal rate and regular rhythm.     Heart sounds: No murmur heard.    No friction rub. No gallop.  Pulmonary:     Effort: Pulmonary effort is normal. No respiratory distress.     Breath sounds: Normal breath sounds. No stridor. No rales.  Abdominal:     General: There is no distension.     Palpations: Abdomen is soft.     Tenderness: There is no abdominal tenderness.  Musculoskeletal:        General: No tenderness.     Cervical back: Normal range of motion and neck supple.  Skin:    General: Skin is warm and dry.     Findings: No erythema or rash.  Neurological:     Mental Status: She is alert and oriented to person, place, and time.     ED Results and Treatments Labs (all labs ordered are listed, but only abnormal results are displayed) Labs Reviewed  BASIC METABOLIC PANEL WITH GFR - Abnormal; Notable for the following components:      Result Value   Glucose, Bld 119 (*)    Creatinine, Ser 1.19 (*)    GFR, Estimated 55 (*)    All other components within normal limits  CBC - Abnormal; Notable for the following components:   WBC 15.0 (*)    All other components within normal limits  D-DIMER, QUANTITATIVE  TROPONIN I (HIGH SENSITIVITY)  TROPONIN I (HIGH SENSITIVITY)                                                                                                                         EKG  EKG Interpretation Date/Time:  Friday January 31 2024 05:01:40 EDT Ventricular Rate:  91 PR Interval:  156 QRS Duration:  104 QT Interval:  340 QTC Calculation: 419 R Axis:   65  Text Interpretation: Sinus rhythm Abnormal inferior Q waves No  significant  change was found Confirmed by Trine Likes [phone]) on 01/31/2024 5:03:30 AM       Radiology DG Chest 2 View Result Date: 01/31/2024 CLINICAL DATA:  53 year old female with chest pain which woke her at 0130 hours. Prior STEMI. EXAM: CHEST - 2 VIEW COMPARISON:  Chest radiographs 05/01/2022 and earlier. FINDINGS: PA and lateral views at 0513 hours. Lung volumes and mediastinal contours remain normal. Visualized tracheal air column is within normal limits. Lung markings appear stable, lungs appear clear. No pneumothorax or pleural effusion. Osteopenia. No acute osseous abnormality identified. Negative visible bowel gas. IMPRESSION: No acute cardiopulmonary abnormality. Electronically Signed   By: VEAR Hurst M.D.   On: 01/31/2024 06:22    Medications Ordered in ED Medications  alum & mag hydroxide-simeth (MAALOX/MYLANTA) 200-200-20 MG/5ML suspension 30 mL (30 mLs Oral Given 01/31/24 9394)    And  lidocaine  (XYLOCAINE ) 2 % viscous mouth solution 15 mL (15 mLs Oral Given 01/31/24 0605)  ketorolac  (TORADOL ) 15 MG/ML injection 15 mg (15 mg Intravenous Given 01/31/24 9350)   Procedures Procedures  (including critical care time) Medical Decision Making / ED Course   Medical Decision Making Amount and/or Complexity of Data Reviewed Labs: ordered. Decision-making details documented in ED Course. Radiology: ordered and independent interpretation performed. Decision-making details documented in ED Course. ECG/medicine tests: ordered and independent interpretation performed. Decision-making details documented in ED Course.  Risk OTC drugs. Prescription drug management.    Pleuritic chest pain.  Differential diagnosis considered.  Workup below.  Atypical for ACS however patient is high risk due to known CAD and stenting.  EKG without acute ischemic changes or evidence of pericarditis.  Initial troponin negative.  Will obtain a second troponin and discussed case with cardiology.  She actually has an  appointment with Dr. Holmes at 8:30 AM this morning for routine follow-up.  CBC with leukocytosis.  Patient reports that white count is normally up but she has been referred to hematology for this with reassuring workup.  No anemia  No significant electrolyte derangements.  Mild hyperglycemia without DKA.  Mild renal sufficiency without overt AKI.  Chest x-ray without evidence of pneumonia, pneumothorax, pulmonary edema pleural effusion.  No mediastinal abnormality.  Presentation not classic for aortic dissection or esophageal perforation.  Low pretest probability for pulmonary embolism.  Dimer obtained and negative.  PE unlikely.  Patient care turned over to oncoming provider. Patient case and results discussed in detail; please see their note for further ED managment.       Final Clinical Impression(s) / ED Diagnoses Final diagnoses:  None    This chart was dictated using voice recognition software.  Despite best efforts to proofread,  errors can occur which can change the documentation meaning.    Trine Likes Moder, MD 01/31/24 [phone]

## 2024-01-31 NOTE — Discharge Instructions (Signed)
 Please follow-up as discussed by cardiology. Return if you are having any new or worsening symptoms.

## 2024-01-31 NOTE — ED Notes (Signed)
 Lab called to add on sedimentation rate and c-reactive protein to previously drawn labs.

## 2024-01-31 NOTE — Consult Note (Addendum)
 Cardiology Consultation   Patient ID: Leslie Hanna MRN: [id number]; DOB: 1970-12-24  Admit date: 01/31/2024 Date of Consult: 01/31/2024  PCP:  Ezra Montie Aquas, FNP   [facility] Providers Cardiologist:  Madonna Large, DO        Patient Profile: Leslie Hanna is a 53 y.o. female with a hx of CAD in May 2024, hypertension, hyperlipidemia, DM2, former smoker, history of Barrett's esophagus/gastric ulcer and history of GERD who is being seen 01/31/2024 for the evaluation of pleuritic chest pain at the request of Dr. Levander.  History of Present Illness: Leslie Hanna is a 53 year old female with past medical history of CAD in May 2024, hypertension, hyperlipidemia, DM2, former smoker, history of Barrett's esophagus/gastric ulcer and history of GERD.  Patient was previously seen for chest pain in May 2024, subsequent cardiac catheterization performed on 11/13/2022 was demonstrated 80% mid LAD lesion followed by 70% lesion, 50% distal LAD lesion, 50% proximal left circumflex lesion with negative RFR.  Patient ultimately underwent PTCA and overlapping DES x 2 to the mid LAD.  Postprocedure, it does appear patient has a 90% ostial D1 residual.  She was last seen by Dr. Large in September 2024 at which time she was doing well.  She is on aspirin  and the Plavix  at home.  Patient has been compliant with her medication.  She was in her usual state of health until 1:30 AM this morning when she woke up was substernal sharp chest discomfort that is worse with deep inspiration.  Symptoms started at 8 out of 10 in intensity, however improved over the next few hours to a 4 out of 10.  She is on medical attention at [facility], ED.  At the time of the interview, she is chest pain-free when she lays still, however has 4 out of 10 chest pain every time she take a deep breath.  Symptom is clearly pleuritic in nature.  She denies any significant difference between her pleuritic symptom between laying down  versus sitting up.  EKG shows ST segment elevation in the inferior lead but no contralateral ST depression.  Looking back, it appears this ST elevation in the inferior lead is chronic.  Her symptom is not worsened with body rotation or palpation.  She was able to use a push lawn more to trim her long for 30 minutes in the past 2 weeks without any exertional symptom.  Her current symptom is clearly different from her previous angina last year according to the patient.  She described the previous angina as a dull ache that was not exacerbated by deep inspiration.  Cardiology service was called to evaluate atypical chest pain.  White blood cell count is elevated at 15, however patient denies any fever, chill or cough.  Creatinine 1.19.  Serial troponin negative x 2.   Past Medical History:  Diagnosis Date   Barrett esophagus    Diabetes mellitus without complication (HCC)    Hyperlipidemia    Hypertension    Post-operative nausea and vomiting    post op headache    Past Surgical History:  Procedure Laterality Date   CORONARY ATHERECTOMY N/A 11/13/2022   Procedure: CORONARY ATHERECTOMY;  Surgeon: Elmira Newman PARAS, MD;  Location: [facility] IN[facility];  Service: Cardiovascular;  Laterality: N/A;   CORONARY PRESSURE/FFR STUDY N/A 11/13/2022   Procedure: CORONARY PRESSURE/FFR STUDY;  Surgeon: Elmira Newman PARAS, MD;  Location: [facility] INVASIVE CV LAB;  Service: Cardiovascular;  Laterality: N/A;   CORONARY STENT INTERVENTION N/A 11/13/2022  Procedure: CORONARY STENT INTERVENTION;  Surgeon: Elmira Newman PARAS, MD;  Location: [facility] INVAS[facility] CV LAB;  Service: Cardiovascular;  Laterality: N/A;   CORONARY ULTRASOUND/IVUS N/A 11/13/2022   Procedure: Coronary Ultrasound/IVUS;  Surgeon: Elmira Newman PARAS, MD;  Location: [facility]VAS[facility] CV [facility];  Service: Cardiovascular;  Laterality: N/A;   LEFT HEART CATH AND CORONARY ANGIOGRAPHY N/A 11/13/2022   Procedure: LEFT HEART CATH AND CORONARY ANGIOGRAPHY;  Surgeon:  Elmira Newman PARAS, MD;  Location: [facility] INVASIVE CV LAB;  Service: Cardiovascular;  Laterality: N/A;   UPPER GASTROINTESTINAL ENDOSCOPY     10 years ago -[place], [place]     Home Medications:  Prior to Admission medications   Medication Sig Start Date End Date Taking? Authorizing Provider  ASPIRIN  LOW DOSE 81 MG tablet SWALLOW ONE WHOLE TABLET BY MOUTH DAILY AS DIRECTED BY DOCTOR 11/16/22   Tolia, Sunit, DO  clopidogrel  (PLAVIX ) 75 MG tablet TAKE 1 TABLET BY MOUTH DAILY WITH BREAKFAST 11/29/23   Tolia, Sunit, DO  dapagliflozin propanediol (FARXIGA) 10 MG TABS tablet Take 10 mg by mouth daily.    [provider]  DULoxetine (CYMBALTA) 60 MG capsule Take 60 mg by mouth daily.    [provider]  ezetimibe  (ZETIA ) 10 MG tablet TAKE 1 TABLET BY MOUTH DAILY 10/28/23   Tolia, Sunit, DO  lisinopril  (ZESTRIL ) 10 MG tablet Take 1 tablet (10 mg total) by mouth daily. 02/28/23 08/27/23  Tolia, Sunit, DO  medroxyPROGESTERone (DEPO-PROVERA) 150 MG/ML injection Inject 150 mg into the muscle every 3 (three) months.    [provider]  metFORMIN (GLUCOPHAGE-XR) 500 MG 24 hr tablet Take 1,000 mg by mouth daily.    [provider]  metoprolol  succinate (TOPROL -XL) 25 MG 24 hr tablet TAKE 1 TABLET BY MOUTH DAILY WITH OR IMMEDIATELY FOLLOWING A MEAL; **MUST KEEP APPOINTMENT FOR FUTURE REFILLS 01/14/24   Tolia, Sunit, DO  nitroGLYCERIN  (NITROSTAT ) 0.4 MG SL tablet Place 1 tablet (0.4 mg total) under the tongue every 5 (five) minutes as needed for up to 25 days for chest pain. 05/02/22 02/28/23  Osa Grate, NP  pantoprazole  (PROTONIX ) 40 MG tablet Take 1 tablet (40 mg total) by mouth daily before breakfast. 11/01/23   Tolia, Sunit, DO  rosuvastatin  (CRESTOR ) 20 MG tablet TAKE 1 TABLET BY MOUTH AT BEDTIME 10/01/23   Tolia, Sunit, DO  tirzepatide (MOUNJARO) 7.5 MG/0.5ML Pen Inject 7.5 mg into the skin every Thursday.    [provider]    Scheduled Meds:  Continuous  Infusions:  PRN Meds:   Allergies:   No Known Allergies  Social History:   Social History   Socioeconomic History   Marital status: Married    Spouse name: Ryan   Number of children: 2   Years of education: Not on file   Highest education level: Not on file  Occupational History   Not on file  Tobacco Use   Smoking status: Former    Current packs/day: 0.00    Average packs/day: 1.5 packs/day for 22.0 years (33.0 ttl pk-yrs)    Types: Cigarettes    Start date: 43    Quit date: 2015    Years since quitting: 10.6   Smokeless tobacco: Never  Vaping Use   Vaping status: Never Used  Substance and Sexual Activity   Alcohol use: No   Drug use: No   Sexual activity: Not on file  Other Topics Concern   Not on file  Social History Narrative   Not on file   Social  Drivers of Corporate investment banker Strain: Not on file  Food Insecurity: Not on file  Transportation Needs: Not on file  Physical Activity: Not on file  Stress: Not on file  Social Connections: Not on file  Intimate Partner Violence: Not on file    Family History:    Family History  Problem Relation Age of Onset   Hyperlipidemia Mother    Hypertension Mother    Thyroid disease Mother    Alzheimer's disease Father    Diabetes Father    Colon polyps Sister    Colon cancer Sister 70   Asthma Sister    Cancer Paternal Aunt        breast   Cancer Paternal Aunt        breast   Cancer Paternal Aunt        breast    Alzheimer's disease Maternal Grandmother    Heart attack Maternal Grandmother    Stroke Maternal Grandmother    Cancer Maternal Grandfather        lung   Emphysema Paternal Grandfather    Pneumonia Daughter      ROS:  Please see the history of present illness.   All other ROS reviewed and negative.     Physical Exam/Data: Vitals:   01/31/24 0515 01/31/24 0530 01/31/24 0545 01/31/24 0700  BP: 122/71 110/65 91/69 118/85  Pulse: 89 89 80 81  Resp: (!) 21 17 17 18   Temp:       TempSrc:      SpO2: 100% 100% 100% 99%  Weight:       No intake or output data in the 24 hours ending 01/31/24 0859    01/31/2024    5:03 AM 02/28/2023   10:13 AM 11/29/2022   10:32 AM  Last 3 Weights  Weight (lbs) 208 lb 15.9 oz 209 lb 215 lb  Weight (kg) 94.8 kg 94.802 kg 97.523 kg     Body mass index is 34.78 kg/m.  General:  Well nourished, well developed, in no acute distress HEENT: normal Neck: no JVD Vascular: No carotid bruits; Distal pulses 2+ bilaterally Cardiac:  normal S1, S2; RRR; no murmur  Lungs:  clear to auscultation bilaterally, no wheezing, rhonchi or rales  Abd: soft, nontender, no hepatomegaly  Ext: no edema Musculoskeletal:  No deformities, BUE and BLE strength normal and equal Skin: warm and dry  Neuro:  CNs 2-12 intact, no focal abnormalities noted Psych:  Normal affect   EKG:  The EKG was personally reviewed and demonstrates: Normal sinus rhythm, J-point elevation in the inferior lead, this appears to be chronic when compared to the previous EKG. Telemetry:  Telemetry was personally reviewed and demonstrates: Normal sinus rhythm, no significant ventricular ectopy.  Relevant CV Studies:  Cath 11/13/2022 Procedures   CORONARY STENT INTERVENTION  CORONARY ATHERECTOMY  CORONARY PRESSURE/FFR STUDY  Coronary Ultrasound/IVUS  LEFT HEART CATH AND CORONARY ANGIOGRAPHY    Conclusion   LM: Ostial 20% stenosis LAD: Prox mild disease         Mid calcific 80% stenosis, followed by 70% stenosis         Distal 50% disease         Pre PCI iFR 0.79, with biggest step up in calcific section of mid LAD Lcx: Ostial 20%, prox 50% stenosis. iFR 0.95 (non-significant) RCA: Mild luminal irregularities       Successful percutaneous coronary intervention mid LAD     Orbital atherectomy, PTCA and overlapping stents placement  PTCA and stents placement Synergy DES 3.5 X 24 mm and 3.0 X 24 mm drug-eluting stents     Post dilatation with 3.75 and 3.25 mm [place] balloons  up to 18 atm   Post atherectomy IVUS MSA 3.7 mm2 Post PCI IVUS MSA >8 mm2   RFR LAD: Pre PCI: 0.79 Post PCI 1st stent: 0.83 Post PCI 2nd stent: 0.91   Coronary Diagrams      Diagnostic Dominance: Right    Intervention             Newman JINNY Lawrence, MD Pager: [phone] Office: [phone]  Laboratory Data: High Sensitivity Troponin:   Recent Labs  Lab 01/31/24 0504 01/31/24 0647  TROPONINIHS 4 4     Chemistry Recent Labs  Lab 01/31/24 0504  NA 137  K 4.0  CL 104  CO2 24  GLUCOSE 119*  BUN 16  CREATININE 1.19*  CALCIUM  9.0  GFRNONAA 55*  ANIONGAP 9    No results for input(s): PROT, ALBUMIN, AST, ALT, ALKPHOS, BILITOT in the last 168 hours. Lipids No results for input(s): CHOL, TRIG, HDL, LABVLDL, LDLCALC, CHOLHDL in the last 168 hours.  Hematology Recent Labs  Lab 01/31/24 0504  WBC 15.0*  RBC 4.92  HGB 14.6  HCT 43.6  MCV 88.6  MCH 29.7  MCHC 33.5  RDW 13.3  PLT 295   Thyroid No results for input(s): TSH, FREET4 in the last 168 hours.  BNPNo results for input(s): BNP, PROBNP in the last 168 hours.  DDimer  Recent Labs  Lab 01/31/24 0504  DDIMER <0.27    Radiology/Studies:  DG Chest 2 View Result Date: 01/31/2024 CLINICAL DATA:  53 year old female with chest pain which woke her at 0130 hours. Prior STEMI. EXAM: CHEST - 2 VIEW COMPARISON:  Chest radiographs 05/01/2022 and earlier. FINDINGS: PA and lateral views at 0513 hours. Lung volumes and mediastinal contours remain normal. Visualized tracheal air column is within normal limits. Lung markings appear stable, lungs appear clear. No pneumothorax or pleural effusion. Osteopenia. No acute osseous abnormality identified. Negative visible bowel gas. IMPRESSION: No acute cardiopulmonary abnormality. Electronically Signed   By: VEAR Hurst M.D.   On: 01/31/2024 06:22     Assessment and Plan: Pleuritic chest pain  - Differential diagnosis include pleurisy  versus pericarditis versus musculoskeletal pain.  Suspicion for ACS or cardiac chest pain fairly low.  Serial troponin negative x 2.  White blood cell count elevated at 15.  - EKG showed J-point elevation in the inferior leads but no contralateral ST depression.  Looking back, this EKG changes appears to be chronic  - With elevated white blood cell count, pleurisy versus pericarditis but unlikely, will obtain ESR and CRP.  - Consider dose of steroid.  Unable to prescribe ibuprofen regimen as the patient is on both aspirin  and Plavix  at home.  He has outpatient follow-up with Dr. Michele on 8/18.  Do not anticipate patient will need further inpatient workup.  Can consider outpatient echocardiogram to make sure she does not have significant pericardial effusion.  CAD: On aspirin  and Plavix  at home.  Received DES x 2 to mid LAD in May 2024, had 90% D1, 50% distal LAD and a 50% proximal left circumflex residual.  Hypertension: Continue home med of metoprolol  succinate and lisinopril .  Hyperlipidemia: On rosuvastatin  and Zetia   DM2: On Farxiga at home.  Former smoker   Risk Assessment/Risk Scores:        For questions or updates, please contact [facility] Please  consult [email or web address] for contact info under    Signed, Scot Ford, PA  01/31/2024 8:59 AM  History and all data above reviewed.  I personally took the history today, performed the physical exam and I formulated the substantive portion of the  assessment and plan.  I reviewed all relevant tests and studies.  The pateint woke this morning with pleuritic chest pain.  This was sharp and not like her previous angina.  It hurst to take a deep breath.  She has had no cough fevers or chills.  No help with NTG at home.  No help with GI cocktail.  She did get relief with Toradol .  Had otherwise been doing OK and in the past few days no acute complaints.  She stocks shelves.  The patient denies any new symptoms such as chest discomfort,  neck or arm discomfort. There has been no new shortness of breath, PND or orthopnea. There have been no reported palpitations, presyncope or syncope.  EKG in ED without acute changes.  EMS run sheet reviewed.  Trop negative.    Patient examined.  I agree with the findings as above.  The patient exam reveals COR:RRR, no rub  ,  Lungs: clear  ,  Abd: Positive bowel sounds, no rebound no guarding, Ext No edeam   .  All available labs, radiology testing, previous records reviewed. Agree with documented assessment and plan.   Chest pain:  Non anginal. No objective evidence of ischemia.  Likely pleuritic.  No further in patient work up is needed.  She can follow in the clinic and this is scheduled. I would suggest PRN Tylenol  for pain management but she could use low dose NSAID for a short course as needed.      [facility]  10:47 AM  01/31/2024

## 2024-01-31 NOTE — ED Notes (Signed)
 Patient transported to X-ray

## 2024-01-31 NOTE — ED Provider Notes (Signed)
  Physical Exam  BP 91/69   Pulse 80   Temp 98.6 F (37 C) (Oral)   Resp 17   Wt 94.8 kg   LMP  (Exact Date)   SpO2 100%   BMI 34.78 kg/m   Physical Exam  Procedures  Procedures  ED Course / MDM    Medical Decision Making Amount and/or Complexity of Data Reviewed Labs: ordered. Radiology: ordered.  Risk OTC drugs. Prescription drug management.   53 yo female ho cad with known 90% residual circumflex. CP loc 930, feels like prior pleurisy but associated lower jaw pain. nitroglycerin  without relief. EKG without acute changes. Trop x 1 negative D-dimer negative Pain only with deep breath Will d.w. cards as has appointment with Dr. Michele at [facility] discussed with Dr. Lavona and cards will see in consult  Patient seen by cardiology and cleared for discharge Reevaluated patient.  Vital signs are stable.  She voices understanding of plan and is in agreement     Levander Houston, MD 01/31/24 1017

## 2024-02-03 ENCOUNTER — Ambulatory Visit: Admitting: Cardiology

## 2024-02-12 ENCOUNTER — Other Ambulatory Visit: Payer: Self-pay | Admitting: Cardiology

## 2024-02-12 DIAGNOSIS — Z955 Presence of coronary angioplasty implant and graft: Secondary | ICD-10-CM

## 2024-02-12 DIAGNOSIS — I2510 Atherosclerotic heart disease of native coronary artery without angina pectoris: Secondary | ICD-10-CM

## 2024-02-12 DIAGNOSIS — R931 Abnormal findings on diagnostic imaging of heart and coronary circulation: Secondary | ICD-10-CM

## 2024-02-18 ENCOUNTER — Encounter (HOSPITAL_BASED_OUTPATIENT_CLINIC_OR_DEPARTMENT_OTHER): Payer: Self-pay

## 2024-02-20 ENCOUNTER — Encounter: Payer: Self-pay | Admitting: Cardiology

## 2024-02-20 ENCOUNTER — Ambulatory Visit: Attending: Cardiology | Admitting: Cardiology

## 2024-02-20 VITALS — BP 111/72 | HR 79 | Resp 16 | Ht 65.0 in | Wt 172.0 lb

## 2024-02-20 DIAGNOSIS — I2584 Coronary atherosclerosis due to calcified coronary lesion: Secondary | ICD-10-CM | POA: Insufficient documentation

## 2024-02-20 DIAGNOSIS — I2510 Atherosclerotic heart disease of native coronary artery without angina pectoris: Secondary | ICD-10-CM | POA: Diagnosis not present

## 2024-02-20 DIAGNOSIS — Z955 Presence of coronary angioplasty implant and graft: Secondary | ICD-10-CM | POA: Insufficient documentation

## 2024-02-20 DIAGNOSIS — I10 Essential (primary) hypertension: Secondary | ICD-10-CM | POA: Diagnosis not present

## 2024-02-20 DIAGNOSIS — R931 Abnormal findings on diagnostic imaging of heart and coronary circulation: Secondary | ICD-10-CM | POA: Diagnosis not present

## 2024-02-20 DIAGNOSIS — E1165 Type 2 diabetes mellitus with hyperglycemia: Secondary | ICD-10-CM | POA: Diagnosis present

## 2024-02-20 NOTE — Patient Instructions (Signed)
 Medication Instructions:  Your physician recommends that you continue on your current medications as directed. Please refer to the Current Medication list given to you today.  *If you need a refill on your cardiac medications before your next appointment, please call your pharmacy*   Follow-Up: At [facility], you and your health needs are our priority.  As part of our continuing mission to provide you with exceptional heart care, our providers are all part of one team.  This team includes your primary Cardiologist (physician) and Advanced Practice Providers or APPs (Physician Assistants and Nurse Practitioners) who all work together to provide you with the care you need, when you need it.  Your next appointment:   December 2026   Provider:   Madonna Large, DO    We recommend signing up for the patient portal called MyChart.  Sign up information is provided on this After Visit Summary.  MyChart is used to connect with patients for Virtual Visits (Telemedicine).  Patients are able to view lab/test results, encounter notes, upcoming appointments, etc.  Non-urgent messages can be sent to your provider as well.   To learn more about what you can do with MyChart, go to [email or web address].

## 2024-02-20 NOTE — Progress Notes (Signed)
 Cardiology Office Note:  .   Date:  02/20/2024  ID:  Leslie Hanna, DOB Oct 10, 1970, MRN [id number] PCP:  Ezra Montie Aquas, FNP  Former Cardiology Providers: None [facility] Providers Cardiologist:  Madonna Large, DO , [facility] (established care 09/12/2021) Electrophysiologist:  None  Click to update primary MD,subspecialty MD or APP then REFRESH:1}    Chief Complaint  Patient presents with   Atherosclerosis of native coronary artery of native heart w   Hospitalization Follow-up    History of Present Illness: .   Leslie Hanna is a 53 y.o. Caucasian female whose past medical history and cardiovascular risk factors includes: CAD s/p PCIx2 mid LAD (May 2024), Severe coronary artery calcification (total CAC 607, 99th percentile), non-insulin-dependent diabetes w/ neuropathy, hypertension, hyperlipidemia, former smoker, history of Barrett's esophagus/gastric ulcer, GERD.   Patient being followed in the practice given her CAD status post coronary interventions.  She was last seen in the office in September 2024 and presents for her 1 year follow-up visit.  However, she had an ER visit on 01/31/2024 and she was seen in consult by Dr. Lavona.  Documentation reviewed.  D-dimer was checked and high since troponins were negative x 2.  Patient's precordial discomfort did not improve with sublingual nitroglycerin  tablet or GI cocktail.  She did have relief with Toradol .  ESR and CRP were also normal.  Recommended outpatient echocardiogram to evaluate for pericardial effusion.  Since her ER visit patient states that she is doing well from a cardiovascular standpoint.  Denies any reoccurrence of chest pain or heart failure symptoms.  Patient has lost approximately 37 pounds since last office visit due to lifestyle changes and also being on Mounjaro.  At times she experiences lightheaded and dizziness likely secondary to soft blood pressures.   Review of Systems: .   Review of Systems   Constitutional: Positive for weight loss.  Cardiovascular:  Negative for chest pain, claudication, irregular heartbeat, leg swelling, near-syncope, orthopnea, palpitations, paroxysmal nocturnal dyspnea and syncope.  Respiratory:  Negative for shortness of breath.   Hematologic/Lymphatic: Negative for bleeding problem.    Studies Reviewed:   EKG: EKG Interpretation Date/Time:  Thursday February 20 2024 10:11:49 EDT Ventricular Rate:  74 PR Interval:  178 QRS Duration:  92 QT Interval:  376 QTC Calculation: 417 R Axis:   100  Text Interpretation: Normal sinus rhythm Rightward axis When compared with ECG of 31-Jan-2024 05:01, Since last tracing rate slower Confirmed by Large Madonna [phone]) on 02/20/2024 10:45:39 AM  Echocardiogram: Left ventricle cavity is normal in size and wall thickness. Normal global wall motion. Normal LV systolic function with EF 59%. Normal diastolic filling pattern. No significant valvular abnormality.  Normal right atrial pressure.    Stress Testing: Exercise nuclear stress test 11/01/2021: Low risk.   Heart Catheterization: 11/13/2022 LM: Ostial 20% stenosis LAD: Prox mild disease         Mid calcific 80% stenosis, followed by 70% stenosis         Distal 50% disease         Pre PCI iFR 0.79, with biggest step up in calcific section of mid LAD Lcx: Ostial 20%, prox 50% stenosis. iFR 0.95 (non-significant) RCA: Mild luminal irregularities       Successful percutaneous coronary intervention mid LAD     Orbital atherectomy, PTCA and overlapping stents placement     PTCA and stents placement Synergy DES 3.5 X 24 mm and 3.0 X 24 mm drug-eluting stents     Post  dilatation with 3.75 and 3.25 mm [place] balloons up to 18 atm   Post atherectomy IVUS MSA 3.7 mm2 Post PCI IVUS MSA >8 mm2   RFR LAD: Pre PCI: 0.79 Post PCI 1st stent: 0.83 Post PCI 2nd stent: 0.91  RADIOLOGY: None  Risk Assessment/Calculations:   NA   Labs:       Latest Ref Rng & Units  01/31/2024    5:04 AM 11/09/2022    8:48 AM 05/01/2022    9:01 PM  CBC  WBC 4.0 - 10.5 K/uL 15.0  10.0  13.0   Hemoglobin 12.0 - 15.0 g/dL 85.3  85.2  85.9   Hematocrit 36.0 - 46.0 % 43.6  47.7  42.8   Platelets 150 - 400 K/uL 295  345  339        Latest Ref Rng & Units 01/31/2024    5:04 AM 11/09/2022    8:48 AM 05/01/2022    9:01 PM  BMP  Glucose 70 - 99 mg/dL 880  771  797   BUN 6 - 20 mg/dL 16  14  16    Creatinine 0.44 - 1.00 mg/dL 8.80  9.10  9.02   BUN/Creat Ratio 9 - 23  16    Sodium 135 - 145 mmol/L 137  140  139   Potassium 3.5 - 5.1 mmol/L 4.0  5.0  4.4   Chloride 98 - 111 mmol/L 104  103  104   CO2 22 - 32 mmol/L 24  22  24    Calcium  8.9 - 10.3 mg/dL 9.0  9.8  9.9       Latest Ref Rng & Units 01/31/2024    5:04 AM 11/09/2022    8:48 AM 05/01/2022    9:01 PM  CMP  Glucose 70 - 99 mg/dL 880  771  797   BUN 6 - 20 mg/dL 16  14  16    Creatinine 0.44 - 1.00 mg/dL 8.80  9.10  9.02   Sodium 135 - 145 mmol/L 137  140  139   Potassium 3.5 - 5.1 mmol/L 4.0  5.0  4.4   Chloride 98 - 111 mmol/L 104  103  104   CO2 22 - 32 mmol/L 24  22  24    Calcium  8.9 - 10.3 mg/dL 9.0  9.8  9.9     Lab Results  Component Value Date   CHOL 96 (L) 10/31/2022   HDL 42 10/31/2022   LDLCALC 35 10/31/2022   LDLDIRECT 49 02/07/2022   TRIG 98 10/31/2022   No results for input(s): LIPOA in the last 8760 hours. No components found for: NTPROBNP No results for input(s): PROBNP in the last 8760 hours. No results for input(s): TSH in the last 8760 hours.  Physical Exam:    Today's Vitals   02/20/24 1009  BP: 111/72  Pulse: 79  Resp: 16  SpO2: 94%  Weight: 172 lb (78 kg)  Height: 5' 5 (1.651 m)   Body mass index is 28.62 kg/m. Wt Readings from Last 3 Encounters:  02/20/24 172 lb (78 kg)  01/31/24 208 lb 15.9 oz (94.8 kg)  02/28/23 209 lb (94.8 kg)    Physical Exam  Constitutional: No distress.  hemodynamically stable  Neck: No JVD present.  Cardiovascular: Normal  rate, regular rhythm, S1 normal and S2 normal. Exam reveals no gallop, no S3 and no S4.  No murmur heard. Pulmonary/Chest: Effort normal and breath sounds normal. No stridor. She has no wheezes. She has no rales.  Musculoskeletal:        General: No edema.     Cervical back: Neck supple.  Skin: Skin is warm.     Impression & Recommendation(s):  Impression:   ICD-10-CM   1. Atherosclerosis of native coronary artery of native heart without angina pectoris  I25.10 EKG 12-Lead    2. S/P coronary artery stent placement  Z95.5     3. Coronary atherosclerosis due to calcified coronary lesion  I25.10    I25.84     4. Agatston CAC score, >400  R93.1     5. Benign hypertension  I10     6. Type 2 diabetes mellitus with hyperglycemia, without long-term current use of insulin (HCC)  E11.65        Recommendation(s):  Atherosclerosis of native coronary artery of native heart without angina pectoris S/P coronary artery stent placement Agatston CAC score, >400 Denies anginal chest pain. Recent ER documentation as well as Dr. Denver consultation note reviewed. EKG today is nonischemic. We discussed undergoing echocardiogram to reevaluate LVEF; however, patient states that since she is asymptomatic she would like to hold off on additional testing at this time.  Which is acceptable. Reemphasized the importance of secondary prevention with focus on improving the modifiable cardiovascular risk factors such as glycemic control, lipid management.  Benign hypertension Office blood pressures are very well-controlled. At times she experiences lightheaded and dizziness likely secondary to soft blood pressures she has lost 37 pounds. If these episodes occur regularly recommend lowering the dose of lisinopril  from 10 mg p.o. daily to 5 mg p.o. daily. Cardiology following peripherally, managed by primary care provider.  Type 2 diabetes mellitus with hyperglycemia, without long-term current use of  insulin (HCC) Reemphasized importance of glycemic control. Currently on statin therapy, Mounjaro, ACE inhibitors, Farxiga   Orders Placed:  Orders Placed This Encounter  Procedures   EKG 12-Lead     Final Medication List:   No orders of the defined types were placed in this encounter.   Medications Discontinued During This Encounter  Medication Reason   tirzepatide (MOUNJARO) 7.5 MG/0.5ML Pen Dose change   medroxyPROGESTERone (DEPO-PROVERA) 150 MG/ML injection Patient Preference     Current Outpatient Medications:    ASPIRIN  LOW DOSE 81 MG tablet, SWALLOW ONE WHOLE TABLET BY MOUTH DAILY AS DIRECTED BY DOCTOR, Disp: 30 tablet, Rfl: 12   clopidogrel  (PLAVIX ) 75 MG tablet, TAKE 1 TABLET BY MOUTH DAILY WITH BREAKFAST, Disp: 90 tablet, Rfl: 0   dapagliflozin propanediol (FARXIGA) 10 MG TABS tablet, Take 10 mg by mouth daily., Disp: , Rfl:    DULoxetine (CYMBALTA) 60 MG capsule, Take 60 mg by mouth daily., Disp: , Rfl:    ezetimibe  (ZETIA ) 10 MG tablet, TAKE 1 TABLET BY MOUTH DAILY, Disp: 30 tablet, Rfl: 5   lisinopril  (ZESTRIL ) 10 MG tablet, Take 1 tablet (10 mg total) by mouth daily., Disp: 90 tablet, Rfl: 1   metFORMIN (GLUCOPHAGE-XR) 500 MG 24 hr tablet, Take 1,000 mg by mouth daily., Disp: , Rfl:    metoprolol  succinate (TOPROL -XL) 25 MG 24 hr tablet, TAKE 1 TABLET BY MOUTH DAILY WITH OR IMMEDIATELY FOLLOWING A MEAL (MUST KEEP APPOINTMENT FOR FUTURE REFILLS), Disp: 30 tablet, Rfl: 0   MOUNJARO 15 MG/0.5ML Pen, Inject 15 mg into the skin once a week., Disp: , Rfl:    nitroGLYCERIN  (NITROSTAT ) 0.4 MG SL tablet, Place 1 tablet (0.4 mg total) under the tongue every 5 (five) minutes as needed for up to 25 days for chest pain., Disp:  25 tablet, Rfl: 3   pantoprazole  (PROTONIX ) 40 MG tablet, Take 1 tablet (40 mg total) by mouth daily before breakfast., Disp: 90 tablet, Rfl: 1   rosuvastatin  (CRESTOR ) 20 MG tablet, TAKE 1 TABLET BY MOUTH AT BEDTIME, Disp: 90 tablet, Rfl: 1  Consent:    NA  Disposition:   1 year follow-up sooner if needed  Her questions and concerns were addressed to her satisfaction. She voices understanding of the recommendations provided during this encounter.    Signed, Madonna Michele HAS, [facility]  A Division of [facility] [address], [place], [place] [address]  02/20/2024

## 2024-03-10 ENCOUNTER — Other Ambulatory Visit: Payer: Self-pay | Admitting: Cardiology

## 2024-03-10 DIAGNOSIS — R931 Abnormal findings on diagnostic imaging of heart and coronary circulation: Secondary | ICD-10-CM

## 2024-03-10 DIAGNOSIS — Z955 Presence of coronary angioplasty implant and graft: Secondary | ICD-10-CM

## 2024-03-10 DIAGNOSIS — I2510 Atherosclerotic heart disease of native coronary artery without angina pectoris: Secondary | ICD-10-CM

## 2024-04-20 ENCOUNTER — Other Ambulatory Visit: Payer: Self-pay

## 2024-04-20 ENCOUNTER — Emergency Department (HOSPITAL_BASED_OUTPATIENT_CLINIC_OR_DEPARTMENT_OTHER): Admitting: Radiology

## 2024-04-20 ENCOUNTER — Emergency Department (HOSPITAL_BASED_OUTPATIENT_CLINIC_OR_DEPARTMENT_OTHER)

## 2024-04-20 ENCOUNTER — Encounter (HOSPITAL_BASED_OUTPATIENT_CLINIC_OR_DEPARTMENT_OTHER): Payer: Self-pay

## 2024-04-20 ENCOUNTER — Emergency Department (HOSPITAL_BASED_OUTPATIENT_CLINIC_OR_DEPARTMENT_OTHER)
Admission: EM | Admit: 2024-04-20 | Discharge: 2024-04-20 | Disposition: A | Discharge status: Home / Self Care | Attending: Emergency Medicine | Admitting: Emergency Medicine

## 2024-04-20 DIAGNOSIS — Z87891 Personal history of nicotine dependence: Secondary | ICD-10-CM | POA: Insufficient documentation

## 2024-04-20 DIAGNOSIS — E1140 Type 2 diabetes mellitus with diabetic neuropathy, unspecified: Secondary | ICD-10-CM | POA: Diagnosis not present

## 2024-04-20 DIAGNOSIS — Z79899 Other long term (current) drug therapy: Secondary | ICD-10-CM | POA: Diagnosis not present

## 2024-04-20 DIAGNOSIS — K22719 Barrett's esophagus with dysplasia, unspecified: Secondary | ICD-10-CM | POA: Diagnosis not present

## 2024-04-20 DIAGNOSIS — Z7902 Long term (current) use of antithrombotics/antiplatelets: Secondary | ICD-10-CM | POA: Insufficient documentation

## 2024-04-20 DIAGNOSIS — I2510 Atherosclerotic heart disease of native coronary artery without angina pectoris: Secondary | ICD-10-CM | POA: Insufficient documentation

## 2024-04-20 DIAGNOSIS — I10 Essential (primary) hypertension: Secondary | ICD-10-CM | POA: Insufficient documentation

## 2024-04-20 DIAGNOSIS — Z7984 Long term (current) use of oral hypoglycemic drugs: Secondary | ICD-10-CM | POA: Insufficient documentation

## 2024-04-20 DIAGNOSIS — R0789 Other chest pain: Secondary | ICD-10-CM

## 2024-04-20 DIAGNOSIS — R079 Chest pain, unspecified: Secondary | ICD-10-CM | POA: Diagnosis present

## 2024-04-20 LAB — CBC
HCT: 42.4 % (ref 36.0–46.0)
Hemoglobin: 14 g/dL (ref 12.0–15.0)
MCH: 28.9 pg (ref 26.0–34.0)
MCHC: 33 g/dL (ref 30.0–36.0)
MCV: 87.4 fL (ref 80.0–100.0)
Platelets: 332 K/uL (ref 150–400)
RBC: 4.85 MIL/uL (ref 3.87–5.11)
RDW: 13.8 % (ref 11.5–15.5)
WBC: 12.4 K/uL — ABNORMAL HIGH (ref 4.0–10.5)
nRBC: 0 % (ref 0.0–0.2)

## 2024-04-20 LAB — BASIC METABOLIC PANEL WITH GFR
Anion gap: 8 (ref 5–15)
BUN: 13 mg/dL (ref 6–20)
CO2: 28 mmol/L (ref 22–32)
Calcium: 9.9 mg/dL (ref 8.9–10.3)
Chloride: 102 mmol/L (ref 98–111)
Creatinine, Ser: 0.77 mg/dL (ref 0.44–1.00)
GFR, Estimated: >60 mL/min (ref >60)
Glucose, Bld: 108 mg/dL — ABNORMAL HIGH (ref 70–99)
Potassium: 4.2 mmol/L (ref 3.5–5.1)
Sodium: 137 mmol/L (ref 135–145)

## 2024-04-20 LAB — TROPONIN T, HIGH SENSITIVITY
Troponin T High Sensitivity: <15 ng/L (ref 0–19)
Troponin T High Sensitivity: <15 ng/L (ref 0–19)

## 2024-04-20 LAB — D-DIMER, QUANTITATIVE: D-Dimer, Quant: 0.52 ug{FEU}/mL — ABNORMAL HIGH (ref 0.00–0.50)

## 2024-04-20 MED ORDER — IOHEXOL 350 MG/ML SOLN
100.0000 mL | Freq: Once | INTRAVENOUS | Status: AC | PRN
  Administered 2024-04-20: 75 mL via INTRAVENOUS

## 2024-04-20 NOTE — Discharge Instructions (Signed)
 Workup today without any acute cause related to heart or lung.  Follow-up with cardiology.  Also follow-up with gastroenterology regarding the Barrett's esophagus.  And also referral given to follow-up with pulmonary medicine.  But no evidence of any blood clots no evidence of any acute cardiac event.  There was an insignificant amount of fluid around the heart and some on around the lung area.

## 2024-04-20 NOTE — ED Triage Notes (Signed)
 Patient with hx of stents, Barrette's esophagus, and pleurisy comes in with chest pain for 2 weeks worsening today now radiating into her neck. She says it starts in the middle of her chest.

## 2024-04-20 NOTE — ED Provider Notes (Addendum)
 [facility] EMERGENCY DEPARTMENT AT [facility] Provider Note   CSN: [id number] Arrival date & time: 04/20/24  9084     Patient presents with: Chest Pain   Leslie Hanna is a 53 y.o. female.   Patient with no known history of coronary artery disease.  Has stents.  Followed by cardiology locally.  Also has a history of Barrett's esophagus gets.  Patient's had 2 weeks worth of chest pain somewhat pleuritic in nature.  Worsening today now radiating into her neck.  Starts in the middle of her chest and goes up.  Patient has not been seen by GI for many years.  Due to circumstances.  Patient was evaluated in August for chest pain did have follow-up with cardiology last seen by cardiology September 4.  Patient is known to have severe coronary artery calcification non-insulin-dependent pendant diabetes with neuropathy hypertension hyperlipidemia former smoker and history of Barrett's esophagus and gastric ulcer and GERD.  Known coronary artery disease status post PCI x 2 in May 2024.       Prior to Admission medications   Medication Sig Start Date End Date Taking? Authorizing Provider  ASPIRIN  LOW DOSE 81 MG tablet SWALLOW ONE WHOLE TABLET BY MOUTH DAILY AS DIRECTED BY DOCTOR 11/16/22   Tolia, Sunit, DO  clopidogrel  (PLAVIX ) 75 MG tablet TAKE 1 TABLET BY MOUTH DAILY WITH BREAKFAST 03/11/24   Tolia, Sunit, DO  dapagliflozin propanediol (FARXIGA) 10 MG TABS tablet Take 10 mg by mouth daily.    [provider]  DULoxetine (CYMBALTA) 60 MG capsule Take 60 mg by mouth daily.    [provider]  ezetimibe  (ZETIA ) 10 MG tablet TAKE 1 TABLET BY MOUTH DAILY 10/28/23   Tolia, Sunit, DO  lisinopril  (ZESTRIL ) 10 MG tablet Take 1 tablet (10 mg total) by mouth daily. 02/28/23 02/20/24  Tolia, Sunit, DO  metFORMIN (GLUCOPHAGE-XR) 500 MG 24 hr tablet Take 1,000 mg by mouth daily.    [provider]  metoprolol  succinate (TOPROL -XL) 25 MG 24 hr tablet TAKE 1 TABLET BY MOUTH DAILY  WITH OR IMMEDIATELY FOLLOWING A MEAL 03/11/24   Tolia, Sunit, DO  nitroGLYCERIN  (NITROSTAT ) 0.4 MG SL tablet Place 1 tablet (0.4 mg total) under the tongue every 5 (five) minutes as needed for up to 25 days for chest pain. 05/02/22 02/20/24  Osa Grate, NP  pantoprazole  (PROTONIX ) 40 MG tablet Take 1 tablet (40 mg total) by mouth daily before breakfast. 11/01/23   Tolia, Sunit, DO  rosuvastatin  (CRESTOR ) 20 MG tablet TAKE 1 TABLET BY MOUTH AT BEDTIME 10/01/23   Tolia, Sunit, DO    Allergies: Patient has no known allergies.    Review of Systems  Constitutional:  Negative for chills and fever.  HENT:  Negative for ear pain and sore throat.   Eyes:  Negative for pain and visual disturbance.  Respiratory:  Negative for cough and shortness of breath.   Cardiovascular:  Positive for chest pain. Negative for palpitations.  Gastrointestinal:  Negative for abdominal pain and vomiting.  Genitourinary:  Negative for dysuria and hematuria.  Musculoskeletal:  Negative for arthralgias and back pain.  Skin:  Negative for color change and rash.  Neurological:  Negative for seizures and syncope.  All other systems reviewed and are negative.   Updated Vital Signs BP 116/62   Pulse 74   Temp (!) 97.4 F (36.3 C) (Oral)   Resp 15   Ht 1.651 m (5' 5)   Wt 75.8 kg   SpO2 99%  BMI 27.79 kg/m   Physical Exam Vitals and nursing note reviewed.  Constitutional:      General: She is not in acute distress.    Appearance: Normal appearance. She is well-developed.  HENT:     Head: Normocephalic and atraumatic.  Eyes:     Extraocular Movements: Extraocular movements intact.     Conjunctiva/sclera: Conjunctivae normal.     Pupils: Pupils are equal, round, and reactive to light.  Cardiovascular:     Rate and Rhythm: Normal rate and regular rhythm.     Heart sounds: No murmur heard. Pulmonary:     Effort: Pulmonary effort is normal. No respiratory distress.     Breath sounds: Normal breath  sounds.  Abdominal:     Palpations: Abdomen is soft.     Tenderness: There is no abdominal tenderness.  Musculoskeletal:        General: No swelling.     Cervical back: Normal range of motion and neck supple.     Right lower leg: No edema.     Left lower leg: No edema.  Skin:    General: Skin is warm and dry.     Capillary Refill: Capillary refill takes less than 2 seconds.  Neurological:     General: No focal deficit present.     Mental Status: She is alert and oriented to person, place, and time.     Cranial Nerves: No cranial nerve deficit.     Sensory: No sensory deficit.     Motor: No weakness.  Psychiatric:        Mood and Affect: Mood normal.     (all labs ordered are listed, but only abnormal results are displayed) Labs Reviewed  BASIC METABOLIC PANEL WITH GFR - Abnormal; Notable for the following components:      Result Value   Glucose, Bld 108 (*)    All other components within normal limits  CBC - Abnormal; Notable for the following components:   WBC 12.4 (*)    All other components within normal limits  D-DIMER, QUANTITATIVE - Abnormal; Notable for the following components:   D-Dimer, Quant 0.52 (*)    All other components within normal limits  TROPONIN T, HIGH SENSITIVITY  TROPONIN T, HIGH SENSITIVITY    EKG: EKG Interpretation Date/Time:  Monday April 20 2024 09:22:55 EST Ventricular Rate:  84 PR Interval:  180 QRS Duration:  105 QT Interval:  386 QTC Calculation: 457 R Axis:   93  Text Interpretation: Sinus rhythm Ventricular trigeminy Borderline right axis deviation Low voltage, precordial leads No significant change since last tracing Confirmed by Banner Huckaba [phone]) on 04/20/2024 10:40:24 AM  Radiology: [facility] Chest 2 View Result Date: 04/20/2024 CLINICAL DATA:  cp EXAM: CHEST - 2 VIEW COMPARISON:  01/31/2024 FINDINGS: No focal airspace consolidation, pleural effusion, or pneumothorax. No cardiomegaly.No acute fracture or destructive lesion.  Multilevel thoracic osteophytosis. IMPRESSION: No acute cardiopulmonary abnormality. Electronically Signed   By: Rogelia Myers M.D.   On: 04/20/2024 10:24     Procedures   Medications Ordered in the ED - No data to display                                  Medical Decision Making Amount and/or Complexity of Data Reviewed Labs: ordered. Radiology: ordered.  Risk Prescription drug management.   Basic metabolic panel normal.  CBC normal other than white count of 12.4.  Platelets normal at  332.  Initial troponin less than 15 but will need delta troponin since things worse today.  Also will get D-dimer.  Because of the pleuritic nature of the chest pain.  Chest x-ray without any acute findings.  If patient D-dimer and second troponin is negative.  Will give her referral to GI medicine also recommend she follow back up with cardiology.  And her primary care doctor.  Can also give referral to [facility] pulmonary medicine as well.  Delta troponin still pending.  D-dimer came in very borderline for age adjustment at 0.52.  She is 53 years old.  In discussion with patient she would like to do the CT angio she is also like to get some more information about her lungs.  So I will order that.  Delta troponin both of them are less than 15.  CT angio chest no evidence of pulmonary embolism.  New small pericardial effusion trace left pleural effusion.  Unchanged 4 mm medial left lower lobe nodule.  No follow-up needed.  Aortic sclerosis coronary artery calcifications.  Can follow-up with cards regarding that.  Patient stable for discharge home  Final diagnoses:  Barrett's esophagus with dysplasia  Atypical chest pain    ED Discharge Orders     None          Geraldene Hamilton, MD 04/20/24 1106    Geraldene Hamilton, MD 04/20/24 1149    Geraldene Hamilton, MD 04/20/24 1426

## 2024-04-21 ENCOUNTER — Other Ambulatory Visit: Payer: Self-pay | Admitting: Cardiology

## 2024-04-21 DIAGNOSIS — I2510 Atherosclerotic heart disease of native coronary artery without angina pectoris: Secondary | ICD-10-CM

## 2024-04-21 DIAGNOSIS — E1169 Type 2 diabetes mellitus with other specified complication: Secondary | ICD-10-CM

## 2024-04-21 DIAGNOSIS — R931 Abnormal findings on diagnostic imaging of heart and coronary circulation: Secondary | ICD-10-CM

## 2024-04-24 ENCOUNTER — Telehealth (HOSPITAL_BASED_OUTPATIENT_CLINIC_OR_DEPARTMENT_OTHER): Payer: Self-pay

## 2024-04-24 NOTE — Telephone Encounter (Signed)
   Pre-operative Risk Assessment    Patient Name: Leslie Hanna  DOB: 10-17-1970 MRN: [id number]   Date of last office visit: 02/20/24 with Dr. Michele Date of next office visit: NA   Request for Surgical Clearance    Procedure:  Colonoscopy and Endoscopy   Date of Surgery:  Clearance 05/28/24                                 Surgeon:  Dr. Kriss Surgeon's Group or Practice Name:  [facility] Phone number:  [phone] Fax number:  [phone]   Type of Clearance Requested:   - Medical  - Pharmacy:  Hold Aspirin  and Clopidogrel  (Plavix ) not indicated   Type of Anesthesia:  Propofol   Additional requests/questions:    Bonney Augustin JONETTA Delores   04/24/2024, 4:12 PM

## 2024-04-24 NOTE — Telephone Encounter (Signed)
 Dr. Michele,   You saw this patient on 02/20/2024. She is now pending colonoscopy/endoscopy on 05/28/2024. She has a history of overlapping stents to mid LAD. Placed on 11/13/2022. Per office protocol, will you please comment on medical clearance and provide recommendations on holding Plavix ? We will recommend she continue aspirin  81 mg daily throughout peri-procedural period.   Please route your response to P CV DIV Preop. I will communicate with requesting office once you have given recommendations.   Thank you!  Barnie Hila, NP

## 2024-04-27 NOTE — Telephone Encounter (Signed)
   Name: Leslie Hanna  DOB: [birth year]  MRN: [id number]   Primary Cardiologist: Madonna Large, DO  Chart reviewed as part of pre-operative protocol coverage. [name] was last seen on 02/20/2024 by Dr. Large.  Per Dr. Large Based on her functional capacity at the last office visit she would be considered acceptable risk for upcoming endoscopy as long as no change in clinical status since last office visit.  Therefore, based on ACC/AHA guidelines, the patient would be an acceptable risk for the planned procedure without further cardiovascular testing.   Per office protocol, he may hold Plavix  for 5 days prior to procedure and should resume as soon as hemodynamically stable postoperatively. Patient should continue aspirin  81 mg daily throughout peri-procedural period.    I will route this recommendation to the requesting party via Epic fax function and remove from pre-op pool. Please call with questions.  Barnie Hila, NP 04/27/2024, 8:07 AM

## 2024-05-11 ENCOUNTER — Other Ambulatory Visit: Payer: Self-pay

## 2024-05-11 DIAGNOSIS — K219 Gastro-esophageal reflux disease without esophagitis: Secondary | ICD-10-CM

## 2024-05-13 MED ORDER — PANTOPRAZOLE SODIUM 40 MG PO TBEC: 40.0000 mg | DELAYED_RELEASE_TABLET | Freq: Every day | ORAL | 3 refills | Status: AC

## 2024-05-25 ENCOUNTER — Other Ambulatory Visit: Payer: Self-pay

## 2024-05-25 DIAGNOSIS — R931 Abnormal findings on diagnostic imaging of heart and coronary circulation: Secondary | ICD-10-CM

## 2024-05-25 DIAGNOSIS — Z955 Presence of coronary angioplasty implant and graft: Secondary | ICD-10-CM

## 2024-05-25 DIAGNOSIS — I2510 Atherosclerotic heart disease of native coronary artery without angina pectoris: Secondary | ICD-10-CM

## 2024-05-27 MED ORDER — EZETIMIBE 10 MG PO TABS: 10.0000 mg | ORAL_TABLET | Freq: Every day | ORAL | 11 refills | Status: AC
